# Patient Record
Sex: Female | Born: 1960
Health system: Southern US, Community
[De-identification: ages and names within clinical notes are randomized; demographics above are authoritative.]

## PROBLEM LIST (undated history)

## (undated) DIAGNOSIS — G4730 Sleep apnea, unspecified: Secondary | ICD-10-CM

## (undated) DIAGNOSIS — E785 Hyperlipidemia, unspecified: Secondary | ICD-10-CM

## (undated) DIAGNOSIS — T7840XA Allergy, unspecified, initial encounter: Secondary | ICD-10-CM

## (undated) DIAGNOSIS — N390 Urinary tract infection, site not specified: Secondary | ICD-10-CM

## (undated) DIAGNOSIS — K219 Gastro-esophageal reflux disease without esophagitis: Secondary | ICD-10-CM

## (undated) DIAGNOSIS — J45909 Unspecified asthma, uncomplicated: Secondary | ICD-10-CM

## (undated) DIAGNOSIS — I10 Essential (primary) hypertension: Secondary | ICD-10-CM

## (undated) DIAGNOSIS — N200 Calculus of kidney: Secondary | ICD-10-CM

## (undated) DIAGNOSIS — Z87442 Personal history of urinary calculi: Secondary | ICD-10-CM

## (undated) HISTORY — DX: Essential (primary) hypertension: I10

## (undated) HISTORY — DX: Unspecified asthma, uncomplicated: J45.909

## (undated) HISTORY — DX: Allergy, unspecified, initial encounter: T78.40XA

## (undated) HISTORY — DX: Calculus of kidney: N20.0

## (undated) HISTORY — DX: Gastro-esophageal reflux disease without esophagitis: K21.9

## (undated) HISTORY — DX: Hyperlipidemia, unspecified: E78.5

## (undated) HISTORY — DX: Urinary tract infection, site not specified: N39.0

---

## 2004-07-27 ENCOUNTER — Ambulatory Visit: Payer: Self-pay | Admitting: Unknown Physician Specialty

## 2006-04-12 ENCOUNTER — Ambulatory Visit: Payer: Self-pay | Admitting: Unknown Physician Specialty

## 2007-08-30 ENCOUNTER — Ambulatory Visit: Payer: Self-pay | Admitting: Unknown Physician Specialty

## 2009-01-13 ENCOUNTER — Ambulatory Visit: Payer: Self-pay | Admitting: Unknown Physician Specialty

## 2010-04-21 ENCOUNTER — Ambulatory Visit: Payer: Self-pay | Admitting: Unknown Physician Specialty

## 2011-07-12 ENCOUNTER — Ambulatory Visit: Payer: Self-pay | Admitting: Unknown Physician Specialty

## 2012-10-23 LAB — HM PAP SMEAR: HM Pap smear: NEGATIVE

## 2012-11-19 ENCOUNTER — Ambulatory Visit: Payer: Self-pay | Admitting: Unknown Physician Specialty

## 2013-10-14 ENCOUNTER — Ambulatory Visit: Payer: Self-pay | Admitting: Adult Health

## 2013-10-15 ENCOUNTER — Encounter: Payer: Self-pay | Admitting: Adult Health

## 2013-10-15 ENCOUNTER — Ambulatory Visit (INDEPENDENT_AMBULATORY_CARE_PROVIDER_SITE_OTHER): Payer: BC Managed Care – PPO | Admitting: Adult Health

## 2013-10-15 ENCOUNTER — Encounter (INDEPENDENT_AMBULATORY_CARE_PROVIDER_SITE_OTHER): Payer: Self-pay

## 2013-10-15 VITALS — BP 148/82 | HR 100 | Temp 98.0°F | Resp 14 | Ht 62.0 in | Wt 184.0 lb

## 2013-10-15 DIAGNOSIS — J302 Other seasonal allergic rhinitis: Secondary | ICD-10-CM

## 2013-10-15 DIAGNOSIS — R062 Wheezing: Secondary | ICD-10-CM

## 2013-10-15 DIAGNOSIS — G479 Sleep disorder, unspecified: Secondary | ICD-10-CM

## 2013-10-15 DIAGNOSIS — K219 Gastro-esophageal reflux disease without esophagitis: Secondary | ICD-10-CM

## 2013-10-15 DIAGNOSIS — J309 Allergic rhinitis, unspecified: Secondary | ICD-10-CM

## 2013-10-15 MED ORDER — ALBUTEROL SULFATE HFA 108 (90 BASE) MCG/ACT IN AERS: 2.0000 | INHALATION_SPRAY | Freq: Four times a day (QID) | RESPIRATORY_TRACT | Status: DC | PRN

## 2013-10-15 MED ORDER — ZOLPIDEM TARTRATE 5 MG PO TABS: 5.0000 mg | ORAL_TABLET | Freq: Every evening | ORAL | Status: DC | PRN

## 2013-10-15 NOTE — Progress Notes (Signed)
Subjective:    Patient ID: Ashlee Bass, female    DOB: [birth year], 52 y.o.   MRN: [id number]  HPI  Patient is a very pleasant 52 y/o female who presents to establish care. She was recently followed by Dr. Silver Huguenin. Will request records. Patient reports seasonal allergies. She has used nasal sprays in the past however she does not like these. She is currently taking allergy medication that only provides relief for 4 hours at a time. She has not tried any of the longer acting allergy medications such as Claritin or Allegra. Patient also has been having some problems with wheezing, cough and chest tightness. These symptoms are also associated with her allergies. Patient is not certain if she has asthma; however, she believes she was told that she did have some asthmatic symptoms. At one point she was started on albuterol inhaler as needed. She currently is out of this medication. Patient also has history of GERD. She currently does not take any medications. She has been having symptoms of esophageal burning and her cough is aggravated by the reflux. Patient also reports sleep disturbance for which she takes Ambien 5 mg with good results. Patient does not take this medication daily. She reports taking medication approximately twice a week.   Past Medical History  Diagnosis Date  . Hypertension   . Allergy   . Asthma   . GERD (gastroesophageal reflux disease)   . Urinary tract infection   . Kidney stones   . Hyperlipidemia      History reviewed. No pertinent past surgical history.   Family History  Problem Relation Age of Onset  . Arthritis Mother   . Hyperlipidemia Mother   . Heart disease Mother     Heart Failure  . Hypertension Mother   . Diabetes Mother   . COPD Mother   . Arthritis Father   . Heart disease Father   . Hyperlipidemia Father   . Hypertension Father   . Kidney disease Father     ESRD - dialysis  . Cancer Paternal Grandmother      History   Social  History  . Marital Status: Married    Spouse Name: Richard    Number of Children: 2  . Years of Education: 10   Occupational History  . Arts administrator   Social History Main Topics  . Smoking status: Never Smoker   . Smokeless tobacco: Not on file  . Alcohol Use: No  . Drug Use: No  . Sexual Activity: Not on file   Other Topics Concern  . Not on file   Social History Narrative   Antoria grew up in [place], [place]. She currently lives at home with her husband of 31 years. They have a son and daughter. She recently became a grandmother (granddaughter). 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 She enjoys spending time with her friends. She also enjoys going out to dinner and watching movies.     Review of Systems  Constitutional: Negative.   HENT: Positive for rhinorrhea.   Eyes: Positive for itching.  Respiratory: Negative.   Cardiovascular: Negative.   Gastrointestinal: Negative.   Endocrine: Negative.   Genitourinary: Negative.   Musculoskeletal: Negative.   Skin: Negative.   Neurological: Negative.   Hematological: Negative.   Psychiatric/Behavioral: Negative.        Objective:   Physical Exam  Constitutional: She is oriented to person, place, and time. She appears well-developed and well-nourished.  No distress.  HENT:  Head: Normocephalic and atraumatic.  Pharyngeal erythema.  Eyes: Conjunctivae and EOM are normal. Pupils are equal, round, and reactive to light.  Neck: Normal range of motion. Neck supple. No tracheal deviation present. No thyromegaly present.  Cardiovascular: Normal rate, regular rhythm, normal heart sounds and intact distal pulses.  Exam reveals no gallop and no friction rub.   No murmur heard. Pulmonary/Chest: Effort normal and breath sounds normal. No respiratory distress. She has no wheezes. She has no rales.  Abdominal: Soft. Bowel sounds are normal. She exhibits no distension and no mass. There is no tenderness.  There is no rebound and no guarding.  Musculoskeletal: Normal range of motion. She exhibits no edema and no tenderness.  Lymphadenopathy:    She has no cervical adenopathy.  Neurological: She is alert and oriented to person, place, and time. She has normal reflexes. No cranial nerve deficit. Coordination normal.  Skin: Skin is warm and dry.  Psychiatric: She has a normal mood and affect. Her behavior is normal. Judgment and thought content normal.   BP 148/82  Pulse 100  Temp(Src) 98 F (36.7 C) (Oral)  Resp 14  Ht 5\' 2"  (1.575 m)  Wt 184 lb (83.462 kg)  BMI 33.65 kg/m2  SpO2 94%        Assessment & Plan:

## 2013-10-15 NOTE — Progress Notes (Signed)
Pre visit review using our clinic review tool, if applicable. No additional management support is needed unless otherwise documented below in the visit note. 

## 2013-10-15 NOTE — Patient Instructions (Signed)
  Try Nexium 1 hour before bedtime to see if your symptoms improve.  Also try an over the counter, longer acting allergy medication such as Claritin, Allegra or Zyrtec.  I have given you a prescription for Ambien.  I have prescribed albuterol inhaler - 2 puffs into the lungs as needed every 6 hours for shortness of breath, wheezing or chest tightness.  Thank you for choosing [facility] for your health care needs.

## 2013-10-16 DIAGNOSIS — J302 Other seasonal allergic rhinitis: Secondary | ICD-10-CM | POA: Insufficient documentation

## 2013-10-16 DIAGNOSIS — K219 Gastro-esophageal reflux disease without esophagitis: Secondary | ICD-10-CM | POA: Insufficient documentation

## 2013-10-16 NOTE — Assessment & Plan Note (Signed)
Episodes of wheezing. Currently no symptoms noted. Will provide patient with prescription for albuterol inhaler 2 puffs into the lungs every 6 hours as needed for wheezing, shortness of breath or chest tightness.

## 2013-10-16 NOTE — Assessment & Plan Note (Signed)
Start over-the-counter PPIs such as omeprazole or Nexium.

## 2013-10-16 NOTE — Assessment & Plan Note (Signed)
Patient has been taking 4-hour allergy medication which is quite sedating. Recommended that she try over-the-counter Claritin, Allegra or Zyrtec. She is agreeable.

## 2013-10-16 NOTE — Assessment & Plan Note (Signed)
Continue Ambien 5 mg when necessary.

## 2013-10-22 ENCOUNTER — Telehealth: Payer: Self-pay | Admitting: Adult Health

## 2013-10-22 MED ORDER — PROAIR HFA 108 (90 BASE) MCG/ACT IN AERS: 2.0000 | INHALATION_SPRAY | Freq: Four times a day (QID) | RESPIRATORY_TRACT | Status: DC | PRN

## 2013-10-22 NOTE — Telephone Encounter (Signed)
ProAir has the same medication as the other inhaler just a different manufacturer. Did she ever get the inhaler? We can send in the prescription for ProAir

## 2013-10-22 NOTE — Telephone Encounter (Signed)
Rx sent to pharmacy by escript, pt notified.  

## 2013-10-22 NOTE — Telephone Encounter (Signed)
Pt states pro air will be the alternative for her insurance to cover. She is not feeling better either, wondering if she should just try the new inhaler or come back in.

## 2013-10-22 NOTE — Telephone Encounter (Signed)
Patient left voicemail asking [place] to call in a different inhaler states the one that was previously called in isn't covered under her insurance.

## 2013-10-22 NOTE — Telephone Encounter (Signed)
It was for albuterol?

## 2014-04-09 ENCOUNTER — Encounter: Payer: Self-pay | Admitting: *Deleted

## 2014-05-01 ENCOUNTER — Other Ambulatory Visit: Payer: Self-pay | Admitting: Adult Health

## 2014-05-01 NOTE — Telephone Encounter (Signed)
Ok to fill 

## 2014-05-22 ENCOUNTER — Ambulatory Visit (INDEPENDENT_AMBULATORY_CARE_PROVIDER_SITE_OTHER)
Admission: RE | Admit: 2014-05-22 | Discharge: 2014-05-22 | Disposition: A | Discharge status: Home / Self Care | Payer: BC Managed Care – PPO | Source: Ambulatory Visit | Attending: Adult Health | Admitting: Adult Health

## 2014-05-22 ENCOUNTER — Ambulatory Visit (INDEPENDENT_AMBULATORY_CARE_PROVIDER_SITE_OTHER): Payer: BC Managed Care – PPO | Admitting: Adult Health

## 2014-05-22 ENCOUNTER — Other Ambulatory Visit (HOSPITAL_COMMUNITY)
Admission: RE | Admit: 2014-05-22 | Discharge: 2014-05-22 | Disposition: A | Discharge status: Home / Self Care | Payer: BC Managed Care – PPO | Source: Ambulatory Visit | Attending: Adult Health | Admitting: Adult Health

## 2014-05-22 ENCOUNTER — Encounter: Payer: Self-pay | Admitting: Adult Health

## 2014-05-22 ENCOUNTER — Telehealth: Payer: Self-pay | Admitting: Adult Health

## 2014-05-22 VITALS — BP 150/74 | HR 100 | Temp 98.4°F | Resp 14 | Ht 61.5 in | Wt 183.5 lb

## 2014-05-22 DIAGNOSIS — M25562 Pain in left knee: Secondary | ICD-10-CM

## 2014-05-22 DIAGNOSIS — R1011 Right upper quadrant pain: Secondary | ICD-10-CM

## 2014-05-22 DIAGNOSIS — Z0000 Encounter for general adult medical examination without abnormal findings: Secondary | ICD-10-CM

## 2014-05-22 DIAGNOSIS — Z1211 Encounter for screening for malignant neoplasm of colon: Secondary | ICD-10-CM

## 2014-05-22 DIAGNOSIS — Z1239 Encounter for other screening for malignant neoplasm of breast: Secondary | ICD-10-CM

## 2014-05-22 DIAGNOSIS — M25569 Pain in unspecified knee: Secondary | ICD-10-CM

## 2014-05-22 DIAGNOSIS — Z01419 Encounter for gynecological examination (general) (routine) without abnormal findings: Secondary | ICD-10-CM

## 2014-05-22 DIAGNOSIS — Z1151 Encounter for screening for human papillomavirus (HPV): Secondary | ICD-10-CM | POA: Insufficient documentation

## 2014-05-22 NOTE — Telephone Encounter (Signed)
Xray of the knee was normal. Try to elevate as much as possible. Rest knee as much as possible and apply ice 2-3 times daily for 1 week. Try taking some ibuprofen to help with inflammation. If persistent problems let me know and will refer you to ortho.

## 2014-05-22 NOTE — Progress Notes (Signed)
Pre visit review using our clinic review tool, if applicable. No additional management support is needed unless otherwise documented below in the visit note. 

## 2014-05-22 NOTE — Patient Instructions (Addendum)
  You had your annual physical exam including PAP.  Please schedule a lab appointment. You will need to be fasting.  Please schedule a nurse visit to irrigate both ears.  We will notify you of lab results once available.  See Eber Jonesarolyn prior to leaving the office for scheduling Mammogram, ultrasound.  You will be [facility] by [facility] at [facility] for your consultation for colonoscopy.  [facility] for an [facility] of your left knee

## 2014-05-22 NOTE — Progress Notes (Signed)
Patient ID: Ashlee Bass, female   DOB: October 26, 1960, 53 y.o.   MRN: [id number]   Subjective:    Patient ID: Ashlee Bass, female    DOB: October 26, 1960, 53 y.o.   MRN: [id number]  HPI  Pt is here for her annual physical exam including breast and pap. She is feeling well overall. She reports RUQ discomfort following meals. Occasional nausea as well. Also reports left knee pain. She believes she may have injured it during exercising doing squats. She reports swelling as well.  Needs mammogram and screening colonoscopy. She is not fasting this morning so will return for fasting labs.   Past Medical History  Diagnosis Date  . Hypertension   . Allergy   . Asthma   . GERD (gastroesophageal reflux disease)   . Urinary tract infection   . Kidney stones   . Hyperlipidemia    Surgical Hx: Non contributory  Family History  Problem Relation Age of Onset  . Arthritis Mother   . Hyperlipidemia Mother   . Heart disease Mother     Heart Failure  . Hypertension Mother   . Diabetes Mother   . COPD Mother   . Arthritis Father   . Heart disease Father   . Hyperlipidemia Father   . Hypertension Father   . Kidney disease Father     ESRD - dialysis  . Cancer Paternal Grandmother      History   Social History  . Marital Status: Married    Spouse Name: Richard    Number of Children: 2  . Years of Education: 10   Occupational History  . Arts administratorAssistant Teacher     Playschool   Social History Main Topics  . Smoking status: Never Smoker   . Smokeless tobacco: Not on file  . Alcohol Use: No  . Drug Use: No  . Sexual Activity: Not on file   Other Topics Concern  . Not on file   Social History Narrative   [name] grew up [place], [place]. She currently lives at home with her husband of 31 years. They have a son and daughter. She recently became a grandmother (granddaughter). 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 She enjoys spending time with her friends. She also enjoys going out  to dinner and watching movies.    Current Outpatient Prescriptions on File Prior to Visit  Medication Sig Dispense Refill  . PROAIR HFA 108 (90 BASE) MCG/ACT inhaler Inhale 2 puffs into the lungs every 6 (six) hours as needed for wheezing or shortness of breath.  1 Inhaler  3  . zolpidem (AMBIEN) 5 MG tablet TAKE ONE TABLET BY MOUTH AT BEDTIME AS NEEDED FOR SLEEP  30 tablet  0   No current facility-administered medications on file prior to visit.    Review of Systems  Constitutional: Negative.   HENT: Negative.   Eyes: Negative.   Respiratory: Negative.   Cardiovascular: Negative.   Gastrointestinal: Positive for abdominal pain (RUQ pain following meals).  Endocrine: Negative.   Genitourinary: Negative.   Musculoskeletal: Positive for joint swelling (left knee swelling and pain).  Skin: Negative.   Allergic/Immunologic: Negative.   Neurological: Negative.   Hematological: Negative.   Psychiatric/Behavioral: Negative.        Objective:  There were no vitals taken for this visit.   Physical Exam  Constitutional: She is oriented to person, place, and time. She appears well-developed and well-nourished. No distress.  HENT:  Head: Normocephalic and atraumatic.  Right  Ear: External ear normal.  Left Ear: External ear normal.  Nose: Nose normal.  Mouth/Throat: Oropharynx is clear and moist.  Eyes: Conjunctivae and EOM are normal. Pupils are equal, round, and reactive to light.  Neck: Normal range of motion. Neck supple. No tracheal deviation present. No thyromegaly present.  Cardiovascular: Normal rate, regular rhythm, normal heart sounds and intact distal pulses.  Exam reveals no gallop and no friction rub.   No murmur heard. Pulmonary/Chest: Effort normal and breath sounds normal. No respiratory distress. She has no wheezes. She has no rales.  Abdominal: Soft. Bowel sounds are normal. She exhibits no distension and no mass. There is no tenderness. There is no rebound and no  guarding.  Reports RUQ pain/discomfort mainly following meals. Negative murphy's sign  Musculoskeletal: Normal range of motion. She exhibits edema and tenderness.  Left knee swelling  Lymphadenopathy:    She has no cervical adenopathy.  Neurological: She is alert and oriented to person, place, and time. She has normal reflexes. No cranial nerve deficit. Coordination normal.  Skin: Skin is warm and dry.  Psychiatric: She has a normal mood and affect. Her behavior is normal. Judgment and thought content normal.      Assessment & Plan:   1. Routine general medical examination at a health care facility Normal physical exam. Screenings addressed and listed separately. Labs ordered for screening. - Lipid panel; Future - Basic metabolic panel; Future  2. Encounter for routine gynecological examination Normal exam. External genitalia without lesions, ulcerations, inflammation, warts or discharge. There are no external hemorrhoids. No cystocele, rectocele or prolapsed uterus. Speculum examination normal. Cervix without inflammation, lesions, growth, nodules or discharge noted. There was no bleeding. Vaginal walls also normal - pink and rugose without inflammation, discharge, ulcers or color changes. Bimanual exam also normal.  No tenderness noted with palpation of the uterus. No adnexal masses appreciated during exam.  3. Screening for breast cancer Schedule today at [facility] prior to leaving the office - MM DIGITAL SCREENING BILATERAL; Future  4. Screen for colon cancer Refer to [facility]. Pt would like to stay in [place]. Not had screening colonoscopy. - Ambulatory referral to Gastroenterology  5. RUQ abdominal pain Pain following meals. She reports ongoing discomfort. Mild nausea occasionally. Ultrasound to r/o gallstones. - US Abdomen Limited RUQ; Future  6. Left knee pain Pain follow squats. She thinks she may have hurt during this. Send for xray. Refer as necessary. - DG Knee Complete  4 Views Left; Future

## 2014-05-23 ENCOUNTER — Encounter: Payer: Self-pay | Admitting: *Deleted

## 2014-05-23 LAB — CYTOLOGY - PAP

## 2014-05-23 NOTE — Telephone Encounter (Signed)
Called and advised patient of results,  verbalized understanding. 

## 2014-05-28 ENCOUNTER — Ambulatory Visit: Payer: Self-pay | Admitting: Adult Health

## 2014-05-29 ENCOUNTER — Ambulatory Visit: Payer: BC Managed Care – PPO

## 2014-05-29 ENCOUNTER — Other Ambulatory Visit (INDEPENDENT_AMBULATORY_CARE_PROVIDER_SITE_OTHER): Payer: BC Managed Care – PPO

## 2014-05-29 DIAGNOSIS — H6123 Impacted cerumen, bilateral: Secondary | ICD-10-CM

## 2014-05-29 DIAGNOSIS — Z0000 Encounter for general adult medical examination without abnormal findings: Secondary | ICD-10-CM

## 2014-05-29 LAB — TSH: TSH: 3.27 u[IU]/mL (ref 0.35–4.50)

## 2014-05-29 LAB — CBC WITH DIFFERENTIAL/PLATELET
Basophils Absolute: 0 10*3/uL (ref 0.0–0.1)
Basophils Relative: 0.6 % (ref 0.0–3.0)
EOS PCT: 4.1 % (ref 0.0–5.0)
Eosinophils Absolute: 0.2 10*3/uL (ref 0.0–0.7)
HEMATOCRIT: 41.4 % (ref 36.0–46.0)
HEMOGLOBIN: 13.6 g/dL (ref 12.0–15.0)
LYMPHS ABS: 1.8 10*3/uL (ref 0.7–4.0)
Lymphocytes Relative: 29.9 % (ref 12.0–46.0)
MCHC: 33 g/dL (ref 30.0–36.0)
MCV: 85.8 fl (ref 78.0–100.0)
MONO ABS: 0.4 10*3/uL (ref 0.1–1.0)
MONOS PCT: 6.6 % (ref 3.0–12.0)
NEUTROS ABS: 3.5 10*3/uL (ref 1.4–7.7)
Neutrophils Relative %: 58.8 % (ref 43.0–77.0)
PLATELETS: 280 10*3/uL (ref 150.0–400.0)
RBC: 4.82 Mil/uL (ref 3.87–5.11)
RDW: 15 % (ref 11.5–15.5)
WBC: 6 10*3/uL (ref 4.0–10.5)

## 2014-05-29 LAB — BASIC METABOLIC PANEL
BUN: 13 mg/dL (ref 6–23)
CHLORIDE: 108 meq/L (ref 96–112)
CO2: 28 meq/L (ref 19–32)
CREATININE: 0.7 mg/dL (ref 0.4–1.2)
Calcium: 9.9 mg/dL (ref 8.4–10.5)
GFR: 91.92 mL/min (ref >60.00)
Glucose, Bld: 113 mg/dL — ABNORMAL HIGH (ref 70–99)
POTASSIUM: 4.6 meq/L (ref 3.5–5.1)
Sodium: 143 mEq/L (ref 135–145)

## 2014-05-29 LAB — LIPID PANEL
CHOL/HDL RATIO: 5
Cholesterol: 239 mg/dL — ABNORMAL HIGH (ref 0–200)
HDL: 45.6 mg/dL (ref >39.00)
LDL Cholesterol: 176 mg/dL — ABNORMAL HIGH (ref 0–99)
NonHDL: 193.4
Triglycerides: 89 mg/dL (ref 0.0–149.0)
VLDL: 17.8 mg/dL (ref 0.0–40.0)

## 2014-05-29 LAB — VITAMIN B12: Vitamin B-12: 203 pg/mL — ABNORMAL LOW (ref 211–911)

## 2014-05-29 LAB — VITAMIN D 25 HYDROXY (VIT D DEFICIENCY, FRACTURES): VITD: 33.69 ng/mL (ref 30.00–100.00)

## 2014-05-29 NOTE — Progress Notes (Signed)
Pre visit review using our clinic review tool, if applicable. No additional management support is needed unless otherwise documented below in the visit note. 

## 2014-06-04 ENCOUNTER — Ambulatory Visit (INDEPENDENT_AMBULATORY_CARE_PROVIDER_SITE_OTHER): Payer: BC Managed Care – PPO | Admitting: *Deleted

## 2014-06-04 ENCOUNTER — Telehealth: Payer: Self-pay | Admitting: *Deleted

## 2014-06-04 DIAGNOSIS — E538 Deficiency of other specified B group vitamins: Secondary | ICD-10-CM

## 2014-06-04 MED ORDER — CYANOCOBALAMIN 1000 MCG/ML IJ SOLN
1000.0000 ug | Freq: Once | INTRAMUSCULAR | Status: AC
  Administered 2014-06-04: 1000 ug via INTRAMUSCULAR

## 2014-06-04 NOTE — Telephone Encounter (Signed)
Pt in office for B12, asked about ultrasound results from last week. Advised her abdominal ultrasound was normal per [name].

## 2014-06-11 ENCOUNTER — Ambulatory Visit (INDEPENDENT_AMBULATORY_CARE_PROVIDER_SITE_OTHER): Payer: BC Managed Care – PPO | Admitting: *Deleted

## 2014-06-11 ENCOUNTER — Other Ambulatory Visit: Payer: Self-pay

## 2014-06-11 ENCOUNTER — Telehealth: Payer: Self-pay | Admitting: Adult Health

## 2014-06-11 DIAGNOSIS — E538 Deficiency of other specified B group vitamins: Secondary | ICD-10-CM

## 2014-06-11 MED ORDER — ZOLPIDEM TARTRATE 5 MG PO TABS: 5.0000 mg | ORAL_TABLET | Freq: Every evening | ORAL | Status: DC | PRN

## 2014-06-11 MED ORDER — METOPROLOL SUCCINATE ER 200 MG PO TB24: 200.0000 mg | ORAL_TABLET | Freq: Every day | ORAL | Status: DC

## 2014-06-11 MED ORDER — CYANOCOBALAMIN 1000 MCG/ML IJ SOLN
1000.0000 ug | Freq: Once | INTRAMUSCULAR | Status: AC
  Administered 2014-06-11: 1000 ug via INTRAMUSCULAR

## 2014-06-11 NOTE — Telephone Encounter (Signed)
Pt came in and stated was needing a refill on her metoprolol 100mg  and ambien 5 mg.

## 2014-06-11 NOTE — Telephone Encounter (Signed)
Metoprolol refilled message sent to [name] requesting approval to print [place] script.

## 2014-06-11 NOTE — Telephone Encounter (Signed)
Patient called requesting a refill on ambien. Is it ok to refill?

## 2014-06-17 ENCOUNTER — Ambulatory Visit: Payer: Self-pay | Admitting: Adult Health

## 2014-06-17 ENCOUNTER — Ambulatory Visit (INDEPENDENT_AMBULATORY_CARE_PROVIDER_SITE_OTHER): Payer: BC Managed Care – PPO

## 2014-06-17 DIAGNOSIS — E538 Deficiency of other specified B group vitamins: Secondary | ICD-10-CM

## 2014-06-17 LAB — HM MAMMOGRAPHY: HM Mammogram: NEGATIVE

## 2014-06-17 MED ORDER — METOPROLOL SUCCINATE ER 100 MG PO TB24: 100.0000 mg | ORAL_TABLET | Freq: Every day | ORAL | Status: DC

## 2014-06-17 MED ORDER — ZOLPIDEM TARTRATE 5 MG PO TABS: 5.0000 mg | ORAL_TABLET | Freq: Every evening | ORAL | Status: DC | PRN

## 2014-06-17 MED ORDER — CYANOCOBALAMIN 1000 MCG/ML IJ SOLN
1000.0000 ug | Freq: Once | INTRAMUSCULAR | Status: AC
  Administered 2014-06-17: 1000 ug via INTRAMUSCULAR

## 2014-06-18 ENCOUNTER — Other Ambulatory Visit: Payer: Self-pay

## 2014-06-18 ENCOUNTER — Telehealth: Payer: Self-pay

## 2014-06-18 MED ORDER — METOPROLOL TARTRATE 100 MG PO TABS: 100.0000 mg | ORAL_TABLET | Freq: Every day | ORAL | Status: DC

## 2014-06-18 NOTE — Telephone Encounter (Signed)
Opened in error

## 2014-06-18 NOTE — Telephone Encounter (Signed)
Received a request fax from [facility] stating patient is on metoprolol tartate . Not metoprolol succinate. Updated medication list and sent new Rx refill to [facility]

## 2014-06-19 ENCOUNTER — Encounter: Payer: Self-pay | Admitting: Adult Health

## 2014-06-25 DIAGNOSIS — R1011 Right upper quadrant pain: Secondary | ICD-10-CM | POA: Insufficient documentation

## 2014-06-26 ENCOUNTER — Ambulatory Visit: Payer: BC Managed Care – PPO

## 2014-06-26 ENCOUNTER — Ambulatory Visit (INDEPENDENT_AMBULATORY_CARE_PROVIDER_SITE_OTHER): Payer: BC Managed Care – PPO | Admitting: *Deleted

## 2014-06-26 DIAGNOSIS — E538 Deficiency of other specified B group vitamins: Secondary | ICD-10-CM

## 2014-06-26 MED ORDER — CYANOCOBALAMIN 1000 MCG/ML IJ SOLN
1000.0000 ug | Freq: Once | INTRAMUSCULAR | Status: AC
  Administered 2014-06-26: 1000 ug via INTRAMUSCULAR

## 2014-08-08 ENCOUNTER — Telehealth: Payer: Self-pay | Admitting: *Deleted

## 2014-08-08 NOTE — Telephone Encounter (Signed)
Pt called states she was given the name of something she could buy over the counter to clean her ears, however she forgot the name.  I spoke with the patient and advised her of Debrox.

## 2014-08-12 ENCOUNTER — Ambulatory Visit (INDEPENDENT_AMBULATORY_CARE_PROVIDER_SITE_OTHER): Payer: BC Managed Care – PPO | Admitting: *Deleted

## 2014-08-12 DIAGNOSIS — E538 Deficiency of other specified B group vitamins: Secondary | ICD-10-CM

## 2014-08-12 MED ORDER — CYANOCOBALAMIN 1000 MCG/ML IJ SOLN
1000.0000 ug | Freq: Once | INTRAMUSCULAR | Status: AC
  Administered 2014-08-12: 1000 ug via INTRAMUSCULAR

## 2014-08-13 ENCOUNTER — Encounter: Payer: Self-pay | Admitting: Family Medicine

## 2014-08-13 ENCOUNTER — Ambulatory Visit (INDEPENDENT_AMBULATORY_CARE_PROVIDER_SITE_OTHER): Payer: BC Managed Care – PPO | Admitting: Family Medicine

## 2014-08-13 VITALS — BP 150/86 | HR 73 | Temp 98.4°F | Ht 61.5 in | Wt 187.5 lb

## 2014-08-13 DIAGNOSIS — H6981 Other specified disorders of Eustachian tube, right ear: Secondary | ICD-10-CM

## 2014-08-13 DIAGNOSIS — H6990 Unspecified Eustachian tube disorder, unspecified ear: Secondary | ICD-10-CM | POA: Insufficient documentation

## 2014-08-13 DIAGNOSIS — J302 Other seasonal allergic rhinitis: Secondary | ICD-10-CM

## 2014-08-13 MED ORDER — FLUTICASONE PROPIONATE 50 MCG/ACT NA SUSP: 2.0000 | Freq: Every day | NASAL | Status: DC

## 2014-08-13 NOTE — Assessment & Plan Note (Signed)
R side  With fullness and dec in hearing  No cerumen impaction  Trial of flonase-demonstrated technique Antihistamine prn Update if no imp in several weeks Would not recommend decongestant with present bp   (needs to disc with PCP)

## 2014-08-13 NOTE — Progress Notes (Signed)
Pre visit review using our clinic review tool, if applicable. No additional management support is needed unless otherwise documented below in the visit note. 

## 2014-08-13 NOTE — Assessment & Plan Note (Signed)
I suspect causing her ETD  Will try otc non sed antihistamine and steroid nasal spray Update if not imp

## 2014-08-13 NOTE — Patient Instructions (Signed)
I think you have eustacian tube dysfunction - ( clogged ear tube from allergies)  You can take antihistamines (claritin/ zyrtec)  Also try flonase nasal spray -- tilt head down and use a small sniff when you try it  Let me know if no improvement in the next several weeks

## 2014-08-13 NOTE — Progress Notes (Signed)
Subjective:    Patient ID: Ashlee Bass, female    DOB: 1961-06-21, 53 y.o.   MRN: [id number]  HPI Here with R ear fullness- and trouble hearing   She had a bit of throbbing in it over the weekend   Used otc ear cleaning solution -did not help  Last irrigation in office was perhaps in the summer   She does have allergies and sinus congestion with weather change  She takes chol trimeton lately   Patient Active Problem List   Diagnosis Date Noted  . Routine general medical examination at a health care facility 05/22/2014  . Encounter for routine gynecological examination 05/22/2014  . GERD (gastroesophageal reflux disease) 10/16/2013  . Wheezing 10/16/2013  . Seasonal allergies 10/16/2013  . Sleep disturbance 10/15/2013   Past Medical History  Diagnosis Date  . Hypertension   . Allergy   . Asthma   . GERD (gastroesophageal reflux disease)   . Urinary tract infection   . Kidney stones   . Hyperlipidemia    No past surgical history on file. History  Substance Use Topics  . Smoking status: Never Smoker   . Smokeless tobacco: Never Used  . Alcohol Use: No   Family History  Problem Relation Age of Onset  . Arthritis Mother   . Hyperlipidemia Mother   . Heart disease Mother     Heart Failure  . Hypertension Mother   . Diabetes Mother   . COPD Mother   . Arthritis Father   . Heart disease Father   . Hyperlipidemia Father   . Hypertension Father   . Kidney disease Father     ESRD - dialysis  . Cancer Paternal Grandmother    Allergies  Allergen Reactions  . Codeine Nausea And Vomiting  . Penicillins Hives  . Azithromycin Itching and Rash   Current Outpatient Prescriptions on File Prior to Visit  Medication Sig Dispense Refill  . metoprolol (LOPRESSOR) 100 MG tablet Take 1 tablet (100 mg total) by mouth daily.  90 tablet  3  . PROAIR HFA 108 (90 BASE) MCG/ACT inhaler Inhale 2 puffs into the lungs every 6 (six) hours as needed for wheezing or shortness  of breath.  1 Inhaler  3  . zolpidem (AMBIEN) 5 MG tablet Take 1 tablet (5 mg total) by mouth at bedtime as needed for sleep.  30 tablet  3   No current facility-administered medications on file prior to visit.      Patient Active Problem List   Diagnosis Date Noted  . Routine general medical examination at a health care facility 05/22/2014  . Encounter for routine gynecological examination 05/22/2014  . GERD (gastroesophageal reflux disease) 10/16/2013  . Wheezing 10/16/2013  . Seasonal allergies 10/16/2013  . Sleep disturbance 10/15/2013   Past Medical History  Diagnosis Date  . Hypertension   . Allergy   . Asthma   . GERD (gastroesophageal reflux disease)   . Urinary tract infection   . Kidney stones   . Hyperlipidemia    No past surgical history on file. History  Substance Use Topics  . Smoking status: Never Smoker   . Smokeless tobacco: Never Used  . Alcohol Use: No   Family History  Problem Relation Age of Onset  . Arthritis Mother   . Hyperlipidemia Mother   . Heart disease Mother     Heart Failure  . Hypertension Mother   . Diabetes Mother   . COPD Mother   . Arthritis Father   .  Heart disease Father   . Hyperlipidemia Father   . Hypertension Father   . Kidney disease Father     ESRD - dialysis  . Cancer Paternal Grandmother    Allergies  Allergen Reactions  . Codeine Nausea And Vomiting  . Penicillins Hives  . Azithromycin Itching and Rash   Current Outpatient Prescriptions on File Prior to Visit  Medication Sig Dispense Refill  . metoprolol (LOPRESSOR) 100 MG tablet Take 1 tablet (100 mg total) by mouth daily.  90 tablet  3  . PROAIR HFA 108 (90 BASE) MCG/ACT inhaler Inhale 2 puffs into the lungs every 6 (six) hours as needed for wheezing or shortness of breath.  1 Inhaler  3  . zolpidem (AMBIEN) 5 MG tablet Take 1 tablet (5 mg total) by mouth at bedtime as needed for sleep.  30 tablet  3   No current facility-administered medications on file  prior to visit.    Review of Systems Review of Systems  Constitutional: Negative for fever, appetite change, fatigue and unexpected weight change.  ENT pos for cong and rhinorrhea and sneezing , neg for ear drainage  Eyes: Negative for pain and visual disturbance.  Respiratory: Negative for cough and shortness of breath.   Cardiovascular: Negative for cp or palpitations    Gastrointestinal: Negative for nausea, diarrhea and constipation.  Genitourinary: Negative for urgency and frequency.  Skin: Negative for pallor or rash   Neurological: Negative for weakness, light-headedness, numbness and headaches.  Hematological: Negative for adenopathy. Does not bruise/bleed easily.  Psychiatric/Behavioral: Negative for dysphoric mood. The patient is not nervous/anxious.         Objective:   Physical Exam  Constitutional: She appears well-developed and well-nourished. No distress.  HENT:  Head: Normocephalic and atraumatic.  Mouth/Throat: Oropharynx is clear and moist.  Nares are injected and congested  Clear rhinorrhea No sinus tenderness   TMs are dull bilaterally   Eyes: Conjunctivae and EOM are normal. Pupils are equal, round, and reactive to light. Right eye exhibits no discharge. Left eye exhibits no discharge.  Neck: Normal range of motion. Neck supple.  Cardiovascular: Normal rate and regular rhythm.   Pulmonary/Chest: Effort normal and breath sounds normal. No respiratory distress. She has no wheezes. She exhibits no tenderness.  Lymphadenopathy:    She has no cervical adenopathy.  Neurological: She is alert. She has normal reflexes. No cranial nerve deficit. She exhibits normal muscle tone. Coordination normal.  Skin: Skin is warm and dry. No pallor.  Psychiatric: She has a normal mood and affect.          Assessment & Plan:   Problem List Items Addressed This Visit     Respiratory   Seasonal allergies - Primary     I suspect causing her ETD  Will try otc non sed  antihistamine and steroid nasal spray Update if not imp       Nervous and Auditory   ETD (eustachian tube dysfunction)     R side  With fullness and dec in hearing  No cerumen impaction  Trial of flonase-demonstrated technique Antihistamine prn Update if no imp in several weeks Would not recommend decongestant with present bp   (needs to disc with PCP)

## 2014-08-19 DIAGNOSIS — Z8371 Family history of colonic polyps: Secondary | ICD-10-CM | POA: Insufficient documentation

## 2014-09-30 LAB — HM COLONOSCOPY

## 2014-12-18 ENCOUNTER — Ambulatory Visit: Payer: Self-pay | Admitting: Gastroenterology

## 2014-12-24 ENCOUNTER — Other Ambulatory Visit: Payer: Self-pay | Admitting: *Deleted

## 2014-12-31 ENCOUNTER — Other Ambulatory Visit: Payer: Self-pay | Admitting: *Deleted

## 2014-12-31 ENCOUNTER — Encounter: Payer: Self-pay | Admitting: Nurse Practitioner

## 2014-12-31 ENCOUNTER — Ambulatory Visit (INDEPENDENT_AMBULATORY_CARE_PROVIDER_SITE_OTHER): Payer: BLUE CROSS/BLUE SHIELD | Admitting: Nurse Practitioner

## 2014-12-31 VITALS — BP 128/72 | HR 76 | Temp 97.6°F | Resp 14 | Ht 61.5 in | Wt 190.0 lb

## 2014-12-31 DIAGNOSIS — G479 Sleep disorder, unspecified: Secondary | ICD-10-CM

## 2014-12-31 DIAGNOSIS — E669 Obesity, unspecified: Secondary | ICD-10-CM | POA: Insufficient documentation

## 2014-12-31 MED ORDER — PHENTERMINE HCL 37.5 MG PO CAPS: 37.5000 mg | ORAL_CAPSULE | ORAL | Status: DC

## 2014-12-31 MED ORDER — ZOLPIDEM TARTRATE 5 MG PO TABS: 5.0000 mg | ORAL_TABLET | Freq: Every evening | ORAL | Status: DC | PRN

## 2014-12-31 NOTE — Progress Notes (Signed)
Pre visit review using our clinic review tool, if applicable. No additional management support is needed unless otherwise documented below in the visit note. 

## 2014-12-31 NOTE — Assessment & Plan Note (Signed)
Continue Ambien 5 mg every other night to help with sleep.

## 2014-12-31 NOTE — Assessment & Plan Note (Signed)
Pt obese and interested in losing weight. She is motivated and would like some help. Gave her the handout with low glycemic index information and exercise info. Phentermine 37.5 capsules daily. Asked her to let me know her BP in 1 week and to stop for a day or two if BP or Pulse rises above given guidelines. If BP and pulse are okay will give 2 months more of medication. 9.5 lbs in 3 months goal.

## 2014-12-31 NOTE — Patient Instructions (Signed)
Please have your vital signs checked a week after starting and let me know the results.  I will need those measurements, or you can return for an RN visit, before I can refill the phentermine for additional months.  If pulse is > 100 or BP is > 140/80 we should repeat your evaluation after being off of phentermine for a few days.  If both are below those parameters,  You can continue the medication and return to see me in 3 months.  Most people take 1/2 tablet in the morning,  The second half by 2 PM to avoid insomnia.   If you have not lost 9.5 lbs (which is 5% of your starting weight)  by the end of the 3 months, the risks of continuing the medication outweigh the benefits and we will have to discontinue it and find a Plan B .

## 2014-12-31 NOTE — Progress Notes (Signed)
   Subjective:    Patient ID: Ashlee Bass, female    DOB: 1961/04/23, 54 y.o.   MRN: [id number]  HPI  Ashlee Bass is a 54 yo female with a CC needing refill of ambien.   1) Staying asleep is hard, been on Ambien 5 mg a year, takes every other day. Still finds it very helpful   2) Daughter getting married in 1 year and is interested in losing weight. A friend is trying phentermine with good luck and she is asking about this option.   Review of Systems  Constitutional: Negative for fever, chills, diaphoresis and fatigue.  Respiratory: Negative for chest tightness, shortness of breath and wheezing.   Cardiovascular: Negative for chest pain, palpitations and leg swelling.  Gastrointestinal: Negative for nausea, vomiting, abdominal pain, diarrhea, blood in stool and abdominal distention.  Skin: Negative for rash.  Neurological: Negative for dizziness, weakness, numbness and headaches.  Psychiatric/Behavioral: Positive for sleep disturbance. The patient is not nervous/anxious.        Controlled on Ambien       Objective:   Physical Exam  Constitutional: She is oriented to person, place, and time. She appears well-developed and well-nourished. No distress.  BP 128/72 mmHg  Pulse 76  Temp(Src) 97.6 F (36.4 C) (Oral)  Resp 14  Ht 5' 1.5" (1.562 m)  Wt 190 lb (86.183 kg)  BMI 35.32 kg/m2  SpO2 98%   HENT:  Head: Normocephalic and atraumatic.  Right Ear: External ear normal.  Left Ear: External ear normal.  Cardiovascular: Normal rate, regular rhythm, normal heart sounds and intact distal pulses.  Exam reveals no gallop and no friction rub.   No murmur heard. Pulmonary/Chest: Effort normal and breath sounds normal. No respiratory distress. She has no wheezes. She has no rales. She exhibits no tenderness.  Abdominal:  Obese  Neurological: She is alert and oriented to person, place, and time. No cranial nerve deficit. She exhibits normal muscle tone. Coordination normal.    Skin: Skin is warm and dry. No rash noted. She is not diaphoretic.  Psychiatric: She has a normal mood and affect. Her behavior is normal. Judgment and thought content normal.      Assessment & Plan:

## 2014-12-31 NOTE — Telephone Encounter (Signed)
error 

## 2015-01-30 ENCOUNTER — Encounter: Payer: Self-pay | Admitting: Nurse Practitioner

## 2015-02-16 LAB — SURGICAL PATHOLOGY

## 2015-04-20 ENCOUNTER — Telehealth: Payer: Self-pay

## 2015-04-20 NOTE — Telephone Encounter (Signed)
Oncology Nurse Navigator Documentation  Oncology Nurse Navigator Flowsheets 04/20/2015  Navigator Encounter Type Telephone  Coordination of Care EUS  Time Spent with Patient 15   EUS arranged for 06/18/15. Pt will get H&P from her medical MD at [facility] and fax copy before procedure.Denies any anticoagulants. Instuctions also mailed to home address.

## 2015-05-28 ENCOUNTER — Telehealth: Payer: Self-pay

## 2015-05-28 NOTE — Telephone Encounter (Signed)
  Oncology Nurse Navigator Documentation    Navigator Encounter Type: Telephone (05/28/15 1100)               Called to see if Ms Devora was able to get appt with primary care physician to get updated H&P for 8/25 EUS. 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 Dr Shelah Lewandowsky office notified.

## 2015-07-27 ENCOUNTER — Other Ambulatory Visit: Payer: Self-pay | Admitting: Nurse Practitioner

## 2015-07-27 NOTE — Telephone Encounter (Signed)
Received a refill request for Zolpidem. Last refilled 12/31/14 for #30 with 3 refills. Last office visit was 12/31/14. Okay to refill?

## 2015-07-29 ENCOUNTER — Other Ambulatory Visit: Payer: Self-pay | Admitting: Nurse Practitioner

## 2015-07-29 NOTE — Telephone Encounter (Signed)
Okay to refill? Last seen in March 2016

## 2015-07-30 ENCOUNTER — Telehealth: Payer: Self-pay | Admitting: *Deleted

## 2015-07-30 ENCOUNTER — Other Ambulatory Visit: Payer: Self-pay

## 2015-07-30 MED ORDER — METOPROLOL TARTRATE 100 MG PO TABS: 100.0000 mg | ORAL_TABLET | Freq: Every day | ORAL | Status: DC

## 2015-07-30 NOTE — Telephone Encounter (Signed)
Rx faxed by Shawna Orleans

## 2015-07-30 NOTE — Telephone Encounter (Signed)
Patient has requested a follow up on her Rx Reill on Ambien and metoprolol( 90 day supply) -Thanks

## 2015-07-30 NOTE — Telephone Encounter (Signed)
Called patient, refills completed.

## 2015-09-07 ENCOUNTER — Telehealth: Payer: Self-pay | Admitting: Nurse Practitioner

## 2015-09-07 NOTE — Telephone Encounter (Signed)
Called to ask if she wanted to make an appointment to be seen in the office.

## 2015-09-07 NOTE — Telephone Encounter (Signed)
Patient called stating she has a cold and was wondering if medication could be sent in for her?

## 2015-09-08 ENCOUNTER — Ambulatory Visit (INDEPENDENT_AMBULATORY_CARE_PROVIDER_SITE_OTHER): Payer: BLUE CROSS/BLUE SHIELD | Admitting: Nurse Practitioner

## 2015-09-08 VITALS — BP 169/86 | HR 82 | Temp 98.7°F | Resp 16 | Ht 61.5 in | Wt 196.4 lb

## 2015-09-08 DIAGNOSIS — J069 Acute upper respiratory infection, unspecified: Secondary | ICD-10-CM

## 2015-09-08 DIAGNOSIS — I10 Essential (primary) hypertension: Secondary | ICD-10-CM | POA: Diagnosis not present

## 2015-09-08 MED ORDER — ZOLPIDEM TARTRATE 5 MG PO TABS: 5.0000 mg | ORAL_TABLET | Freq: Every evening | ORAL | Status: DC | PRN

## 2015-09-08 MED ORDER — PROAIR HFA 108 (90 BASE) MCG/ACT IN AERS: 2.0000 | INHALATION_SPRAY | Freq: Four times a day (QID) | RESPIRATORY_TRACT | Status: DC | PRN

## 2015-09-08 MED ORDER — DOXYCYCLINE HYCLATE 100 MG PO TABS: 100.0000 mg | ORAL_TABLET | Freq: Two times a day (BID) | ORAL | Status: DC

## 2015-09-08 NOTE — Progress Notes (Signed)
Pre visit review using our clinic review tool, if applicable. No additional management support is needed unless otherwise documented below in the visit note. 

## 2015-09-08 NOTE — Patient Instructions (Signed)
Lopressor has refills and you just need to contact your pharmacy.   Doxycyline 100 mg twice daily x 7 days Please take a probiotic ( Align, Floraque or Culturelle) while you are on the antibiotic to prevent a serious antibiotic associated diarrhea  Called clostirudium dificile colitis and a vaginal yeast infection  Inhaler for wheezing  Follow up for weight loss concerns in 2-4 weeks.

## 2015-09-08 NOTE — Progress Notes (Signed)
Patient ID: Ashlee Bass, female    DOB: 18-Nov-1960  Age: 54 y.o. MRN: [id number]  CC: URI   HPI Ashlee Bass presents for URI symptoms x 6 days.  1) Pt reports 6 days of nasal and chest Congestion, wheezing, coughing non-productive, and headache.  denies fever, chills, rashes, N/V/D Sick contacts with pre-schoolers   Patient also wants to discuss weight concerns today (deferred to another day)   History Ashlee Bass has a past medical history of Hypertension; Allergy; Asthma; GERD (gastroesophageal reflux disease); Urinary tract infection; Kidney stones; and Hyperlipidemia.   She has no past surgical history on file.   Her family history includes Arthritis in her father and mother; COPD in her mother; Cancer in her paternal grandmother; Diabetes in her mother; Heart disease in her father and mother; Hyperlipidemia in her father and mother; Hypertension in her father and mother; Kidney disease in her father.She reports that she has never smoked. She has never used smokeless tobacco. She reports that she does not drink alcohol or use illicit drugs.  Outpatient Prescriptions Prior to Visit  Medication Sig Dispense Refill  . fluticasone (FLONASE) 50 MCG/ACT nasal spray Place 2 sprays into both nostrils daily. 16 g 6  . metoprolol (LOPRESSOR) 100 MG tablet Take 1 tablet (100 mg total) by mouth daily. 90 tablet 3  . omeprazole (PRILOSEC) 20 MG capsule Take by mouth.    . phentermine 37.5 MG capsule Take 1 capsule (37.5 mg total) by mouth every morning. 30 capsule 0  . PROAIR HFA 108 (90 BASE) MCG/ACT inhaler Inhale 2 puffs into the lungs every 6 (six) hours as needed for wheezing or shortness of breath. 1 Inhaler 3  . zolpidem (AMBIEN) 5 MG tablet TAKE ONE TABLET BY MOUTH AT BEDTIME AS NEEDED FOR SLEEP 30 tablet 0   No facility-administered medications prior to visit.    ROS Review of Systems  HENT: Positive for congestion, ear pain, postnasal drip, rhinorrhea, sinus pressure,  sneezing and sore throat. Negative for ear discharge, trouble swallowing and voice change.   Eyes: Negative for visual disturbance.  Respiratory: Positive for cough, chest tightness and wheezing. Negative for shortness of breath.   Cardiovascular: Negative for chest pain, palpitations and leg swelling.  Gastrointestinal: Negative for nausea, vomiting, diarrhea and constipation.  Neurological: Positive for headaches. Negative for dizziness.    Objective:  BP 169/86 mmHg  Pulse 82  Temp(Src) 98.7 F (37.1 C)  Resp 16  Ht 5' 1.5" (1.562 m)  Wt 196 lb 6.4 oz (89.086 kg)  BMI 36.51 kg/m2  SpO2 96%  Physical Exam  Constitutional: She is oriented to person, place, and time. She appears well-developed and well-nourished. No distress.  HENT:  Head: Normocephalic and atraumatic.  Right Ear: External ear normal.  Left Ear: External ear normal.  Mouth/Throat: No oropharyngeal exudate.  TM's clear bilaterally  Eyes: EOM are normal. Pupils are equal, round, and reactive to light. Right eye exhibits no discharge. Left eye exhibits no discharge. No scleral icterus.  Neck: Normal range of motion. Neck supple. No thyromegaly present.  Cardiovascular: Normal rate, regular rhythm and normal heart sounds.  Exam reveals no gallop and no friction rub.   No murmur heard. Pulmonary/Chest: Effort normal. No respiratory distress. She has wheezes. She has no rales. She exhibits no tenderness.  Lymphadenopathy:    She has no cervical adenopathy.  Neurological: She is alert and oriented to person, place, and time. No cranial nerve deficit. She exhibits normal muscle tone. Coordination normal.  Skin: Skin is warm and dry. No rash noted. She is not diaphoretic.  Psychiatric: She has a normal mood and affect. Her behavior is normal. Judgment and thought content normal.   Assessment & Plan:   Ashlee Bass was seen today for uri.  Diagnoses and all orders for this visit:  Benign essential HTN -     Comp Met  (CMET) -     CBC with Differential/Platelet  Acute URI  Other orders -     PROAIR HFA 108 (90 BASE) MCG/ACT inhaler; Inhale 2 puffs into the lungs every 6 (six) hours as needed for wheezing or shortness of breath. -     zolpidem (AMBIEN) 5 MG tablet; Take 1 tablet (5 mg total) by mouth at bedtime as needed. for sleep -     doxycycline (VIBRA-TABS) 100 MG tablet; Take 1 tablet (100 mg total) by mouth 2 (two) times daily.   I have discontinued Ashlee Bass phentermine. I have also changed her zolpidem. Additionally, I am having her start on doxycycline. Lastly, I am having her maintain her omeprazole, fluticasone, metoprolol, and PROAIR HFA.  Meds ordered this encounter  Medications  . PROAIR HFA 108 (90 BASE) MCG/ACT inhaler    Sig: Inhale 2 puffs into the lungs every 6 (six) hours as needed for wheezing or shortness of breath.    Dispense:  1 Inhaler    Refill:  3    Pt's insurance will cover Proair    Order Specific Question:  Supervising Provider    Answer:  Deborra Medina L [[id number]]  . zolpidem (AMBIEN) 5 MG tablet    Sig: Take 1 tablet (5 mg total) by mouth at bedtime as needed. for sleep    Dispense:  30 tablet    Refill:  1    Order Specific Question:  Supervising Provider    Answer:  Deborra Medina L [[id number]]  . doxycycline (VIBRA-TABS) 100 MG tablet    Sig: Take 1 tablet (100 mg total) by mouth 2 (two) times daily.    Dispense:  14 tablet    Refill:  0    Order Specific Question:  Supervising Provider    Answer:  Crecencio Mc [[id number]]     Follow-up: Return in about 4 weeks (around 10/06/2015) for Weight loss follow up .

## 2015-09-09 ENCOUNTER — Other Ambulatory Visit: Payer: BLUE CROSS/BLUE SHIELD

## 2015-09-09 LAB — CBC WITH DIFFERENTIAL/PLATELET
BASOS PCT: 0.8 % (ref 0.0–3.0)
Basophils Absolute: 0.1 10*3/uL (ref 0.0–0.1)
EOS ABS: 0.7 10*3/uL (ref 0.0–0.7)
Eosinophils Relative: 7.5 % — ABNORMAL HIGH (ref 0.0–5.0)
HEMATOCRIT: 42.5 % (ref 36.0–46.0)
Hemoglobin: 13.6 g/dL (ref 12.0–15.0)
LYMPHS PCT: 25.9 % (ref 12.0–46.0)
Lymphs Abs: 2.5 10*3/uL (ref 0.7–4.0)
MCHC: 32.2 g/dL (ref 30.0–36.0)
MCV: 86.1 fl (ref 78.0–100.0)
MONO ABS: 0.7 10*3/uL (ref 0.1–1.0)
Monocytes Relative: 7.6 % (ref 3.0–12.0)
NEUTROS ABS: 5.5 10*3/uL (ref 1.4–7.7)
Neutrophils Relative %: 58.2 % (ref 43.0–77.0)
PLATELETS: 307 10*3/uL (ref 150.0–400.0)
RBC: 4.93 Mil/uL (ref 3.87–5.11)
RDW: 14.5 % (ref 11.5–15.5)
WBC: 9.5 10*3/uL (ref 4.0–10.5)

## 2015-09-09 LAB — COMPREHENSIVE METABOLIC PANEL
ALT: 24 U/L (ref 0–35)
AST: 26 U/L (ref 0–37)
Albumin: 4.1 g/dL (ref 3.5–5.2)
Alkaline Phosphatase: 97 U/L (ref 39–117)
BUN: 9 mg/dL (ref 6–23)
CO2: 30 mEq/L (ref 19–32)
Calcium: 9.9 mg/dL (ref 8.4–10.5)
Chloride: 103 mEq/L (ref 96–112)
Creatinine, Ser: 0.68 mg/dL (ref 0.40–1.20)
GFR: 96.14 mL/min (ref >60.00)
Glucose, Bld: 119 mg/dL — ABNORMAL HIGH (ref 70–99)
Potassium: 4.2 mEq/L (ref 3.5–5.1)
Sodium: 138 mEq/L (ref 135–145)
Total Bilirubin: 0.3 mg/dL (ref 0.2–1.2)
Total Protein: 7.4 g/dL (ref 6.0–8.3)

## 2015-09-15 ENCOUNTER — Telehealth: Payer: Self-pay | Admitting: Nurse Practitioner

## 2015-09-15 ENCOUNTER — Other Ambulatory Visit: Payer: Self-pay | Admitting: Nurse Practitioner

## 2015-09-15 MED ORDER — BENZONATATE 100 MG PO CAPS: 100.0000 mg | ORAL_CAPSULE | Freq: Two times a day (BID) | ORAL | Status: DC | PRN

## 2015-09-15 MED ORDER — METHYLPREDNISOLONE 4 MG PO TABS: ORAL_TABLET | ORAL | Status: DC

## 2015-09-15 NOTE — Telephone Encounter (Signed)
Left message giving patient instructions. 

## 2015-09-15 NOTE — Telephone Encounter (Signed)
Pt called about still not better from the upper respiratory. Pt has some antibiotic left and she does not feel like it's working. Pt wants to know if she can have something stronger? Pt wants to also know if she can have something called in for her cough, pt recommends if she can have the pills instead of the syrup?  Pharmacy is [facility] [address] - [place], [place] - [id number]

## 2015-09-15 NOTE — Telephone Encounter (Signed)
Will you call pt and let her know that since the antibiotic is not helpful, it is not bacterial. I will call in a 6 day prednisone taper and tessalon Perles for her coughing. Bronchitis coughs usually last 3-4 weeks. Call us if anything changes and visit urgent care if worsening over the holiday.

## 2015-09-20 ENCOUNTER — Encounter: Payer: Self-pay | Admitting: Nurse Practitioner

## 2015-09-20 DIAGNOSIS — J309 Allergic rhinitis, unspecified: Secondary | ICD-10-CM | POA: Insufficient documentation

## 2015-09-20 DIAGNOSIS — I10 Essential (primary) hypertension: Secondary | ICD-10-CM | POA: Insufficient documentation

## 2015-09-20 DIAGNOSIS — D649 Anemia, unspecified: Secondary | ICD-10-CM | POA: Insufficient documentation

## 2015-09-20 NOTE — Assessment & Plan Note (Signed)
Patient is feeling poorly and has 6 days of symptoms. She appears in accordance with her symptoms. Will treat with doxycyline, Proair HFA (wheezing) and follow up as needed. Encouraged probiotics.

## 2015-09-20 NOTE — Assessment & Plan Note (Signed)
BP Readings from Last 3 Encounters:  09/08/15 169/86  12/31/14 128/72  08/13/14 150/86   Uncontrolled today and pt is feeling ill. Will obtain CBC w/ diff and CMET since none in awhile.

## 2015-11-13 ENCOUNTER — Ambulatory Visit: Payer: BLUE CROSS/BLUE SHIELD | Admitting: Family Medicine

## 2015-11-16 ENCOUNTER — Ambulatory Visit (INDEPENDENT_AMBULATORY_CARE_PROVIDER_SITE_OTHER): Payer: BLUE CROSS/BLUE SHIELD | Admitting: Family Medicine

## 2015-11-16 ENCOUNTER — Other Ambulatory Visit: Payer: Self-pay | Admitting: Family Medicine

## 2015-11-16 ENCOUNTER — Encounter: Payer: Self-pay | Admitting: Family Medicine

## 2015-11-16 VITALS — BP 142/100 | HR 99 | Temp 98.0°F | Ht 61.5 in | Wt 195.1 lb

## 2015-11-16 DIAGNOSIS — N3000 Acute cystitis without hematuria: Secondary | ICD-10-CM | POA: Insufficient documentation

## 2015-11-16 DIAGNOSIS — N3001 Acute cystitis with hematuria: Secondary | ICD-10-CM

## 2015-11-16 LAB — POC URINALSYSI DIPSTICK (AUTOMATED)
Glucose, UA: 100
Nitrite, UA: POSITIVE
PROTEIN UA: 100
SPEC GRAV UA: 1.02
UROBILINOGEN UA: 4
pH, UA: 5

## 2015-11-16 MED ORDER — NITROFURANTOIN MONOHYD MACRO 100 MG PO CAPS: 100.0000 mg | ORAL_CAPSULE | Freq: Two times a day (BID) | ORAL | Status: DC

## 2015-11-16 MED ORDER — METOPROLOL TARTRATE 100 MG PO TABS: 100.0000 mg | ORAL_TABLET | Freq: Every day | ORAL | Status: DC

## 2015-11-16 NOTE — Progress Notes (Signed)
Subjective:  Patient ID: Ashlee Bass, female    DOB: [birth year]  Age: 55 y.o. MRN: [id number]  CC: ? UTI  HPI:  55 year old female presents to clinic today with complaints of possible UTI.  Patient states that since Friday she's been experiencing burning with urination as well as urinary frequency. No associated fever, flank pain, nausea, vomiting, abdominal pain. She has reported that she's had chills a few times. She reports that she has had some improvement with Azo. No exacerbating factors.   Social Hx   Social History   Social History  . Marital Status: Married    Spouse Name: Richard  . Number of Children: 2  . Years of Education: 10   Occupational History  . Arts administrator   Social History Bass Topics  . Smoking status: Never Smoker   . Smokeless tobacco: Never Used  . Alcohol Use: No  . Drug Use: No  . Sexual Activity: Not Asked   Other Topics Concern  . None   Social History Narrative   Ceairra grew up in [place], [place]. She currently lives at home with her husband of 31 years. They have a son and daughter. She recently became a grandmother (granddaughter). 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 She enjoys spending time with her friends. She also enjoys going out to dinner and watching movies.   Review of Systems  Constitutional: Negative for fever.  Gastrointestinal: Negative for abdominal pain.  Genitourinary: Positive for dysuria and frequency.   Objective:  BP 142/100 mmHg  Pulse 99  Temp(Src) 98 F (36.7 C) (Oral)  Ht 5' 1.5" (1.562 m)  Wt 195 lb 2 oz (88.508 kg)  BMI 36.28 kg/m2  SpO2 98%  BP/Weight 11/16/2015 09/08/2015 12/31/2014  Systolic BP 142 169 128  Diastolic BP 100 86 72  Wt. (Lbs) 195.13 196.4 190  BMI 36.28 36.51 35.32   Physical Exam  Constitutional: She appears well-developed. No distress.  Cardiovascular: Normal rate and regular rhythm.   Murmur heard. Pulmonary/Chest: Effort normal. No  respiratory distress. She has no wheezes. She has no rales.  Abdominal: Soft. She exhibits no distension. There is no tenderness. There is no rebound and no guarding.  Neurological: She is alert.  Psychiatric: She has a normal mood and affect.  Vitals reviewed.  Lab Results  Component Value Date   WBC 9.5 09/08/2015   HGB 13.6 09/08/2015   HCT 42.5 09/08/2015   PLT 307.0 09/08/2015   GLUCOSE 119* 09/08/2015   CHOL 239* 05/29/2014   TRIG 89.0 05/29/2014   HDL 45.60 05/29/2014   LDLCALC 176* 05/29/2014   ALT 24 09/08/2015   AST 26 09/08/2015   NA 138 09/08/2015   K 4.2 09/08/2015   CL 103 09/08/2015   CREATININE 0.68 09/08/2015   BUN 9 09/08/2015   CO2 30 09/08/2015   TSH 3.27 05/29/2014   Results for orders placed or performed in visit on 11/16/15 (from the past 24 hour(s))  POCT Urinalysis Dipstick (Automated)     Status: Abnormal   Collection Time: 11/16/15  2:59 PM  Result Value Ref Range   Color, UA orange    Clarity, UA clear    Glucose, UA 100    Bilirubin, UA small    Ketones, UA trace    Spec Grav, UA 1.020    Blood, UA small    pH, UA 5.0    Protein, UA 100    Urobilinogen, UA 4.0  Nitrite, UA pos    Leukocytes, UA large (3+) (A) Negative   Assessment & Plan:   Problem List Items Addressed This Visit    Acute cystitis - Primary    New problem. History and urinalysis consistent with UTI. Given severe allergy to penicillin, treating with Macrobid.      Relevant Medications   nitrofurantoin, macrocrystal-monohydrate, (MACROBID) 100 MG capsule   Other Relevant Orders   POCT Urinalysis Dipstick (Automated) (Completed)      Meds ordered this encounter  Medications  . metoprolol (LOPRESSOR) 100 MG tablet    Sig: Take 1 tablet (100 mg total) by mouth daily.    Dispense:  90 tablet    Refill:  3  . nitrofurantoin, macrocrystal-monohydrate, (MACROBID) 100 MG capsule    Sig: Take 1 capsule (100 mg total) by mouth 2 (two) times daily.    Dispense:  10  capsule    Refill:  0    Follow-up: PRN  Everlene Other DO [facility]

## 2015-11-16 NOTE — Progress Notes (Signed)
Pre visit review using our clinic review tool, if applicable. No additional management support is needed unless otherwise documented below in the visit note. 

## 2015-11-16 NOTE — Patient Instructions (Signed)
Take the antibiotic as prescribed.  Call if you worsen or fail to improve.  Take care  Dr.Boykin Baetz  

## 2015-11-16 NOTE — Assessment & Plan Note (Signed)
New problem. History and urinalysis consistent with UTI. Given severe allergy to penicillin, treating with Macrobid.

## 2015-12-29 ENCOUNTER — Telehealth: Payer: Self-pay | Admitting: Nurse Practitioner

## 2015-12-29 NOTE — Telephone Encounter (Signed)
Notified pt that rx was filled 10/2015

## 2015-12-29 NOTE — Telephone Encounter (Signed)
Pt called about needing a refill for metoprolol (LOPRESSOR) 100 MG tablet. Pharmacy is [facility] [address] - [place], [place] - [id number]. Call pt @ [phone]. Thank you!

## 2016-01-05 ENCOUNTER — Other Ambulatory Visit: Payer: Self-pay | Admitting: Nurse Practitioner

## 2016-01-05 DIAGNOSIS — Z1231 Encounter for screening mammogram for malignant neoplasm of breast: Secondary | ICD-10-CM

## 2016-02-04 ENCOUNTER — Ambulatory Visit: Payer: BLUE CROSS/BLUE SHIELD

## 2016-02-05 ENCOUNTER — Ambulatory Visit
Admission: RE | Admit: 2016-02-05 | Discharge: 2016-02-05 | Disposition: A | Discharge status: Home / Self Care | Payer: BLUE CROSS/BLUE SHIELD | Source: Ambulatory Visit | Attending: Nurse Practitioner | Admitting: Nurse Practitioner

## 2016-02-05 DIAGNOSIS — Z1231 Encounter for screening mammogram for malignant neoplasm of breast: Secondary | ICD-10-CM

## 2016-08-03 ENCOUNTER — Ambulatory Visit (INDEPENDENT_AMBULATORY_CARE_PROVIDER_SITE_OTHER): Payer: BLUE CROSS/BLUE SHIELD | Admitting: Family

## 2016-08-03 ENCOUNTER — Ambulatory Visit (INDEPENDENT_AMBULATORY_CARE_PROVIDER_SITE_OTHER): Payer: BLUE CROSS/BLUE SHIELD

## 2016-08-03 ENCOUNTER — Encounter: Payer: Self-pay | Admitting: Family

## 2016-08-03 VITALS — BP 154/80 | HR 99 | Temp 98.1°F | Wt 197.4 lb

## 2016-08-03 DIAGNOSIS — J209 Acute bronchitis, unspecified: Secondary | ICD-10-CM | POA: Diagnosis not present

## 2016-08-03 DIAGNOSIS — J45909 Unspecified asthma, uncomplicated: Secondary | ICD-10-CM | POA: Insufficient documentation

## 2016-08-03 DIAGNOSIS — R05 Cough: Secondary | ICD-10-CM | POA: Diagnosis not present

## 2016-08-03 MED ORDER — PREDNISONE 10 MG PO TABS: ORAL_TABLET | ORAL | 0 refills | Status: DC

## 2016-08-03 MED ORDER — PROMETHAZINE-DM 6.25-15 MG/5ML PO SYRP: 5.0000 mL | ORAL_SOLUTION | Freq: Every evening | ORAL | 1 refills | Status: DC | PRN

## 2016-08-03 NOTE — Patient Instructions (Addendum)
Please make an appointment to establish care. You are also overdue for your physical.  Chest x-ray does not show bacterial infection. You may start her prednisone.  BP log; goal < 140/90  Increase intake of clear fluids. Congestion is best treated by hydration, when mucus is wetter, it is thinner, less sticky, and easier to expel from the body, either through coughing up drainage, or by blowing your nose.   Get plenty of rest.   Use saline nasal drops and blow your nose frequently. Run a humidifier at night and elevate the head of the bed. Vicks Vapor rub will help with congestion and cough. Steam showers and sinus massage for congestion.   Use Acetaminophen or Ibuprofen as needed for fever or pain. Avoid second hand smoke. Even the smallest exposure will worsen symptoms.   Over the counter medications you can try include Delsym for cough, a decongestant for congestion, and Mucinex or Robitussin as an expectorant. Be sure to just get the plain Mucinex or Robitussin that just has one medication (Guaifenesen). We don't recommend the combination products. Note, be sure to drink two glasses of water with each dose of Mucinex as the medication will not work well without adequate hydration.   You can also try a teaspoon of honey to see if this will help reduce cough. Throat lozenges can sometimes be beneficial as well.    This illness will typically last 7 - 10 days.   Please follow up with our clinic if you develop a fever greater than 101 F, symptoms worsen, or do not resolve in the next week.

## 2016-08-03 NOTE — Progress Notes (Signed)
Subjective:    Patient ID: Ashlee Bass, female    DOB: 01/05/1961, 55 y.o.   MRN: [id number]  CC: Ashlee MainRobin Squires Bass is a 55 y.o. female who presents today for an acute visit.    HPI: Patient here for acute visit with chief complaint of dry cough for one week, worsening. Worse at night. No fever. Has been using her inhaler. 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  OTC cough syrup and flonase with little relief. No fever. Occasionally hears 'rattle.' states 'sometimes SOB' with exercise and with viral URI. Denies exertional chest pain or pressure, numbness or tingling radiating to left arm or jaw, palpitations, dizziness, frequent headaches, changes in vision.    H/o asthma triggered by Seasonal allergies.   Patient has not been taking her metoprolol the past few days when she has not felt well.     HISTORY:  Past Medical History:  Diagnosis Date  . Allergy   . Asthma   . GERD (gastroesophageal reflux disease)   . Hyperlipidemia   . Hypertension   . Kidney stones   . Urinary tract infection    History reviewed. No pertinent surgical history. Family History  Problem Relation Age of Onset  . Arthritis Mother   . Hyperlipidemia Mother   . Heart disease Mother     Heart Failure  . Hypertension Mother   . Diabetes Mother   . COPD Mother   . Arthritis Father   . Heart disease Father   . Hyperlipidemia Father   . Hypertension Father   . Kidney disease Father     ESRD - dialysis  . Arthritis Paternal Grandmother     Allergies: Codeine; Penicillins; Telithromycin; and Azithromycin Current Outpatient Prescriptions on File Prior to Visit  Medication Sig Dispense Refill  . fluticasone (FLONASE) 50 MCG/ACT nasal spray Place 2 sprays into both nostrils daily. 16 g 6  . metoprolol (LOPRESSOR) 100 MG tablet Take 1 tablet (100 mg total) by mouth daily. 90 tablet 3  . omeprazole (PRILOSEC) 20 MG capsule Take by mouth.    Marland Kitchen.  PROAIR HFA 108 (90 BASE) MCG/ACT inhaler Inhale 2 puffs into the lungs every 6 (six) hours as needed for wheezing or shortness of breath. 1 Inhaler 3  . zolpidem (AMBIEN) 5 MG tablet Take 1 tablet (5 mg total) by mouth at bedtime as needed. for sleep 30 tablet 1   No current facility-administered medications on file prior to visit.     Social History  Substance Use Topics  . Smoking status: Never Smoker  . Smokeless tobacco: Never Used  . Alcohol use No    Review of Systems  Constitutional: Negative for chills and fever.  HENT: Negative for congestion.   Respiratory: Positive for cough, shortness of breath and wheezing.   Cardiovascular: Negative for chest pain and palpitations.  Gastrointestinal: Negative for nausea and vomiting.      Objective:    BP (!) 154/80   Pulse 99   Temp 98.1 F (36.7 C) (Oral)   Wt 197 lb 6.4 oz (89.5 kg)   SpO2 95%   BMI 36.69 kg/m    Physical Exam  Constitutional: She appears well-developed and well-nourished.  HENT:  Head: Normocephalic and atraumatic.  Right Ear: Hearing, tympanic membrane, external ear and ear canal normal. No drainage, swelling or tenderness. No foreign bodies. Tympanic membrane is not erythematous and not bulging. No middle ear effusion. No decreased hearing is  noted.  Left Ear: Hearing, tympanic membrane, external ear and ear canal normal. No drainage, swelling or tenderness. No foreign bodies. Tympanic membrane is not erythematous and not bulging.  No middle ear effusion. No decreased hearing is noted.  Nose: Nose normal. No rhinorrhea. Right sinus exhibits no maxillary sinus tenderness and no frontal sinus tenderness. Left sinus exhibits no maxillary sinus tenderness and no frontal sinus tenderness.  Mouth/Throat: Uvula is midline, oropharynx is clear and moist and mucous membranes are normal. No oropharyngeal exudate, posterior oropharyngeal edema, posterior oropharyngeal erythema or tonsillar abscesses.  Eyes:  Conjunctivae are normal.  Cardiovascular: Regular rhythm, normal heart sounds and normal pulses.   Pulmonary/Chest: Effort normal and breath sounds normal. She has no wheezes. She has no rhonchi. She has no rales.  Lymphadenopathy:       Head (right side): No submental, no submandibular, no tonsillar, no preauricular, no posterior auricular and no occipital adenopathy present.       Head (left side): No submental, no submandibular, no tonsillar, no preauricular, no posterior auricular and no occipital adenopathy present.    She has no cervical adenopathy.  Neurological: She is alert.  Skin: Skin is warm and dry.  Psychiatric: She has a normal mood and affect. Her speech is normal and behavior is normal. Thought content normal.  Vitals reviewed.      Assessment & Plan:    Acute bronchitis, unspecified organism Suspect viral bronchitis which has affected asthma. No fever or focal infiltrate seen on CXR to suggest PNA. Patient and I agreed on short course of prednisone. Return precautions given.  Patient will keep BP log and take metoprolol. we will follow.   - DG Chest 2 View - predniSONE (DELTASONE) 10 MG tablet; Take 40 mg by mouth on day 1, then taper 10 mg daily until gone  Dispense: 10 tablet; Refill: 0 - promethazine-dextromethorphan (PROMETHAZINE-DM) 6.25-15 MG/5ML syrup; Take 5 mLs by mouth at bedtime as needed for cough.  Dispense: 40 mL; Refill: 1    I have discontinued Ms. Mckamie's nitrofurantoin (macrocrystal-monohydrate). I am also having her start on predniSONE and promethazine-dextromethorphan. Additionally, I am having her maintain her omeprazole, fluticasone, PROAIR HFA, zolpidem, and metoprolol.   Meds ordered this encounter  Medications  . predniSONE (DELTASONE) 10 MG tablet    Sig: Take 40 mg by mouth on day 1, then taper 10 mg daily until gone    Dispense:  10 tablet    Refill:  0    Order Specific Question:   Supervising Provider    Answer:   Duncan Dull L  [[id number]]  . promethazine-dextromethorphan (PROMETHAZINE-DM) 6.25-15 MG/5ML syrup    Sig: Take 5 mLs by mouth at bedtime as needed for cough.    Dispense:  40 mL    Refill:  1    Order Specific Question:   Supervising Provider    Answer:   Sherlene Shams [[id number]]    Return precautions given.   Risks, benefits, and alternatives of the medications and treatment plan prescribed today were discussed, and patient expressed understanding.   Education regarding symptom management and diagnosis given to patient on AVS.  Continue to follow with Carollee Leitz, NP for routine health maintenance.   Ashlee Main and I agreed with plan.   Rennie Plowman, FNP

## 2016-08-16 ENCOUNTER — Encounter: Payer: Self-pay | Admitting: Family

## 2016-08-16 ENCOUNTER — Ambulatory Visit (INDEPENDENT_AMBULATORY_CARE_PROVIDER_SITE_OTHER): Payer: BLUE CROSS/BLUE SHIELD | Admitting: Family

## 2016-08-16 VITALS — BP 160/80 | HR 98 | Temp 98.2°F | Ht 62.0 in | Wt 200.0 lb

## 2016-08-16 DIAGNOSIS — M25562 Pain in left knee: Secondary | ICD-10-CM

## 2016-08-16 DIAGNOSIS — G4700 Insomnia, unspecified: Secondary | ICD-10-CM

## 2016-08-16 DIAGNOSIS — J209 Acute bronchitis, unspecified: Secondary | ICD-10-CM | POA: Diagnosis not present

## 2016-08-16 DIAGNOSIS — I10 Essential (primary) hypertension: Secondary | ICD-10-CM

## 2016-08-16 DIAGNOSIS — Z0000 Encounter for general adult medical examination without abnormal findings: Secondary | ICD-10-CM | POA: Insufficient documentation

## 2016-08-16 DIAGNOSIS — G8929 Other chronic pain: Secondary | ICD-10-CM

## 2016-08-16 DIAGNOSIS — Z7689 Persons encountering health services in other specified circumstances: Secondary | ICD-10-CM | POA: Diagnosis not present

## 2016-08-16 MED ORDER — HYDROXYZINE HCL 50 MG PO TABS: 50.0000 mg | ORAL_TABLET | Freq: Every evening | ORAL | 2 refills | Status: DC | PRN

## 2016-08-16 MED ORDER — METOPROLOL TARTRATE 100 MG PO TABS: 100.0000 mg | ORAL_TABLET | Freq: Every day | ORAL | 3 refills | Status: DC

## 2016-08-16 MED ORDER — DOXYCYCLINE HYCLATE 100 MG PO TABS: 100.0000 mg | ORAL_TABLET | Freq: Two times a day (BID) | ORAL | 0 refills | Status: DC

## 2016-08-16 MED ORDER — LISINOPRIL 5 MG PO TABS: 5.0000 mg | ORAL_TABLET | Freq: Every day | ORAL | 3 refills | Status: DC

## 2016-08-16 NOTE — Assessment & Plan Note (Signed)
Chronic. Patient desires referral to orthopedic so she can resume an exercise program.  referral placed.

## 2016-08-16 NOTE — Assessment & Plan Note (Signed)
Reviewed past medical social and family history with patient. Patient will bring records of colonoscopy and Pap smear as I am unable to see these. Patient will follow-up with CPE in the next couple of months.

## 2016-08-16 NOTE — Patient Instructions (Addendum)
Need records of colonoscopy and last pap.  Please call GYN and ensure you are not due for Pap.  I suspect that your infection is viral in nature.  As discussed, I advise that you wait to fill the antibiotic after 1-2 days of symptom management to see if your symptoms improve. If you do not show improvement, you may take the antibiotic as prescribed.   Trial of atarax   New medication for blood pressure. Lab appt in one week.   Sleep study  Follow up in one month.

## 2016-08-16 NOTE — Progress Notes (Signed)
Subjective:    Patient ID: Ashlee Bass, female    DOB: 1960/12/19, 55 y.o.   MRN: [id number]  CC: Ashlee MainRobin Bass Colin is a 55 y.o. female who presents today for follow up.   HPI: Patient for follow-up and to establish care.  Insomnia- Hasn't been on Ambien for past several months and not sleeping well. Started a couple of years ago when started menopause. A lot of times would take half a pill. Trouble both falling asleep and staying asleep. Trying to limit caffeine. Some nights 'restless, figits.' Goes to bed at various times. No anxiety or depression.   Patient was seen 2 weeks ago for bronchitis and treated with prednisone, promethazine DM with no relief. At that time chest x-ray was clear. She reports she still has a  Dry cough. Some relief with Nyquil.Unsure if has decongestant component Cough worse at night. Occasional wheeze which improves with inhaler. Ashlee Bass.   HTN- running 160/80. HR has been running in 90's at home. Has been told in past that she had 'extra beats.' Put on lopressor decade ago when she had daughter years ago. Denies exertional chest pain or pressure, numbness or tingling radiating to left arm or jaw, palpitations, dizziness, frequent headaches, changes in vision, or shortness of breath.   Chronic left knee pain and would like ortho consult.   Per patient,  had colonoscopy and EGD 5 years ago. Had one polpy removed per patient. Requesting records.  She is up-to-date on her mammogram. Pap smear a year ago with [facility].  Normal per patient; unable to see records.      HISTORY:  Past Medical History:  Diagnosis Date  . Allergy   . Asthma   . GERD (gastroesophageal reflux disease)   . Hyperlipidemia   . Hypertension   . Kidney stones   . Urinary tract infection    History reviewed. No pertinent surgical history. Family History  Problem Relation Age of Onset  . Arthritis Mother   . Hyperlipidemia Mother   . Heart disease Mother     Heart Failure   . Hypertension Mother   . Diabetes Mother   . COPD Mother   . Arthritis Father   . Heart disease Father   . Hyperlipidemia Father   . Hypertension Father   . Kidney disease Father     ESRD - dialysis  . Arthritis Paternal Grandmother     Allergies: Codeine; Penicillins; Telithromycin; and Azithromycin Current Outpatient Prescriptions on File Prior to Visit  Medication Sig Dispense Refill  . fluticasone (FLONASE) 50 MCG/ACT nasal spray Place 2 sprays into both nostrils daily. 16 g 6  . omeprazole (PRILOSEC) 20 MG capsule Take by mouth.    Ashlee Bass. PROAIR HFA 108 (90 BASE) MCG/ACT inhaler Inhale 2 puffs into the lungs every 6 (six) hours as needed for wheezing or shortness of breath. 1 Inhaler 3   No current facility-administered medications on file prior to visit.     Social History  Substance Use Topics  . Smoking status: Never Smoker  . Smokeless tobacco: Never Used  . Alcohol use No    Review of Systems  Constitutional: Negative for chills and fever.  HENT: Positive for congestion. Negative for rhinorrhea, sinus pressure, sneezing and sore throat.   Respiratory: Positive for cough, shortness of breath (with coughing) and wheezing.   Cardiovascular: Negative for chest pain and palpitations.  Gastrointestinal: Negative for nausea and vomiting.  Psychiatric/Behavioral: Positive for sleep disturbance.  Objective:    BP (!) 160/80   Pulse 98   Temp 98.2 F (36.8 C) (Oral)   Ht 5\' 2"  (1.575 m)   Wt 200 lb (90.7 kg)   SpO2 95%   BMI 36.58 kg/m  BP Readings from Last 3 Encounters:  08/16/16 (!) 160/80  08/03/16 (!) 154/80  11/16/15 (!) 142/100   Wt Readings from Last 3 Encounters:  08/16/16 200 lb (90.7 kg)  08/03/16 197 lb 6.4 oz (89.5 kg)  11/16/15 195 lb 2 oz (88.5 kg)    Physical Exam  Constitutional: She appears well-developed and well-nourished.  HENT:  Head: Normocephalic and atraumatic.  Right Ear: Hearing, tympanic membrane, external ear and ear canal  normal. No drainage, swelling or tenderness. No foreign bodies. Tympanic membrane is not erythematous and not bulging. No middle ear effusion. No decreased hearing is noted.  Left Ear: Hearing, tympanic membrane, external ear and ear canal normal. No drainage, swelling or tenderness. No foreign bodies. Tympanic membrane is not erythematous and not bulging.  No middle ear effusion. No decreased hearing is noted.  Nose: Nose normal. No rhinorrhea. Right sinus exhibits no maxillary sinus tenderness and no frontal sinus tenderness. Left sinus exhibits no maxillary sinus tenderness and no frontal sinus tenderness.  Mouth/Throat: Uvula is midline, oropharynx is clear and moist and mucous membranes are normal. No oropharyngeal exudate, posterior oropharyngeal edema, posterior oropharyngeal erythema or tonsillar abscesses.  Eyes: Conjunctivae are normal.  Cardiovascular: Regular rhythm, normal heart sounds and normal pulses.   Pulmonary/Chest: Effort normal and breath sounds normal. She has no wheezes. She has no rhonchi. She has no rales.  Lymphadenopathy:       Head (right side): No submental, no submandibular, no tonsillar, no preauricular, no posterior auricular and no occipital adenopathy present.       Head (left side): No submental, no submandibular, no tonsillar, no preauricular, no posterior auricular and no occipital adenopathy present.    She has no cervical adenopathy.  Neurological: She is alert.  Skin: Skin is warm and dry.  Psychiatric: She has a normal mood and affect. Her speech is normal and behavior is normal. Thought content normal.  Vitals reviewed.      Assessment & Plan:   Problem List Items Addressed This Visit      Cardiovascular and Mediastinum   Benign essential HTN    Elevated BP today. Suspect OTC decongestant have contributed to elevated heart rate today. EKG shows sinus tachycardia. No acute ischemia. No prior EKG to compare to . Patient will monitor heart rate and  blood pressure at home and will call us with results. Also advised her to stop all decongestants. Start lisinopril for better BP control. Pending CMP in one week.       Relevant Medications   metoprolol (LOPRESSOR) 100 MG tablet   lisinopril (PRINIVIL,ZESTRIL) 5 MG tablet   Other Relevant Orders   EKG 12-Lead (Completed)     Respiratory   Acute bronchitis    SaO2 95. Afebrile. Due to persistent symptoms, patient and I agreed that she would wait another day or 2 with symptomatic treatment and it does not start to improve, she may start doxycycline. Return precautions given.      Relevant Medications   doxycycline (VIBRA-TABS) 100 MG tablet     Other   Chronic pain of left knee    Chronic. Patient desires referral to orthopedic so she can resume an exercise program.  referral placed.      Relevant Orders  Ambulatory referral to Orthopedic Surgery   Insomnia    Trial of Atarax. Education regarding sleep hygiene.Had been on ambien in the past and we discussed the risks of this medication. Pending sleep study for reported snoring.  F/u one month.       Relevant Medications   hydrOXYzine (ATARAX/VISTARIL) 50 MG tablet   Other Relevant Orders   Nocturnal polysomnography (NPSG)   Encounter to establish care - Primary    Reviewed past medical social and family history with patient. Patient will bring records of colonoscopy and Pap smear as I am unable to see these. Patient will follow-up with CPE in the next couple of months.      Relevant Orders   CBC with Differential/Platelet   Comprehensive metabolic panel   Hemoglobin A1c   Lipid panel   TSH   VITAMIN D 25 Hydroxy (Vit-D Deficiency, Fractures)    Other Visit Diagnoses   None.      I have discontinued Ms. Boberg's zolpidem, predniSONE, and promethazine-dextromethorphan. I am also having her start on doxycycline, hydrOXYzine, and lisinopril. Additionally, I am having her maintain her omeprazole, fluticasone, PROAIR HFA,  and metoprolol.   Meds ordered this encounter  Medications  . doxycycline (VIBRA-TABS) 100 MG tablet    Sig: Take 1 tablet (100 mg total) by mouth 2 (two) times daily.    Dispense:  10 tablet    Refill:  0    Order Specific Question:   Supervising Provider    Answer:   Duncan Dull L [[id number]]  . metoprolol (LOPRESSOR) 100 MG tablet    Sig: Take 1 tablet (100 mg total) by mouth daily.    Dispense:  90 tablet    Refill:  3    Order Specific Question:   Supervising Provider    Answer:   Duncan Dull L [[id number]]  . hydrOXYzine (ATARAX/VISTARIL) 50 MG tablet    Sig: Take 1 tablet (50 mg total) by mouth at bedtime as needed for anxiety (insomnia). Take 30 to 60 minutes before bedtime.    Dispense:  30 tablet    Refill:  2    Order Specific Question:   Supervising Provider    Answer:   Duncan Dull L [[id number]]  . lisinopril (PRINIVIL,ZESTRIL) 5 MG tablet    Sig: Take 1 tablet (5 mg total) by mouth daily.    Dispense:  90 tablet    Refill:  3    Order Specific Question:   Supervising Provider    Answer:   Sherlene Shams [[id number]]    Return precautions given.   Risks, benefits, and alternatives of the medications and treatment plan prescribed today were discussed, and patient expressed understanding.   Education regarding symptom management and diagnosis given to patient on AVS.  Continue to follow with Carollee Leitz, NP for routine health maintenance.   Ashlee Main and I agreed with plan.   Rennie Plowman, FNP

## 2016-08-16 NOTE — Assessment & Plan Note (Addendum)
Elevated BP today. Suspect OTC decongestant have contributed to elevated heart rate today. EKG shows sinus tachycardia. No acute ischemia. No prior EKG to compare to . Patient will monitor heart rate and blood pressure at home and will call us with results. Also advised her to stop all decongestants. Start lisinopril for better BP control. Pending CMP in one week.

## 2016-08-16 NOTE — Assessment & Plan Note (Addendum)
SaO2 95. Afebrile. Due to persistent symptoms, patient and I agreed that she would wait another day or 2 with symptomatic treatment and it does not start to improve, she may start doxycycline. Return precautions given.

## 2016-08-16 NOTE — Assessment & Plan Note (Addendum)
Trial of Atarax. Education regarding sleep hygiene.Had been on ambien in the past and we discussed the risks of this medication. Pending sleep study for reported snoring.  F/u one month.

## 2016-08-23 ENCOUNTER — Other Ambulatory Visit (INDEPENDENT_AMBULATORY_CARE_PROVIDER_SITE_OTHER): Payer: BLUE CROSS/BLUE SHIELD

## 2016-08-23 DIAGNOSIS — Z7689 Persons encountering health services in other specified circumstances: Secondary | ICD-10-CM | POA: Diagnosis not present

## 2016-08-23 LAB — CBC WITH DIFFERENTIAL/PLATELET
BASOS PCT: 0.8 % (ref 0.0–3.0)
Basophils Absolute: 0.1 10*3/uL (ref 0.0–0.1)
EOS PCT: 5.4 % — AB (ref 0.0–5.0)
Eosinophils Absolute: 0.4 10*3/uL (ref 0.0–0.7)
HEMATOCRIT: 41.6 % (ref 36.0–46.0)
HEMOGLOBIN: 13.9 g/dL (ref 12.0–15.0)
LYMPHS PCT: 27.5 % (ref 12.0–46.0)
Lymphs Abs: 2.2 10*3/uL (ref 0.7–4.0)
MCHC: 33.4 g/dL (ref 30.0–36.0)
MCV: 84.3 fl (ref 78.0–100.0)
MONO ABS: 0.6 10*3/uL (ref 0.1–1.0)
Monocytes Relative: 7.1 % (ref 3.0–12.0)
Neutro Abs: 4.7 10*3/uL (ref 1.4–7.7)
Neutrophils Relative %: 59.2 % (ref 43.0–77.0)
Platelets: 358 10*3/uL (ref 150.0–400.0)
RBC: 4.93 Mil/uL (ref 3.87–5.11)
RDW: 14.8 % (ref 11.5–15.5)
WBC: 8 10*3/uL (ref 4.0–10.5)

## 2016-08-23 LAB — COMPREHENSIVE METABOLIC PANEL
ALBUMIN: 4.2 g/dL (ref 3.5–5.2)
ALT: 16 U/L (ref 0–35)
AST: 17 U/L (ref 0–37)
Alkaline Phosphatase: 90 U/L (ref 39–117)
BUN: 13 mg/dL (ref 6–23)
CALCIUM: 10.2 mg/dL (ref 8.4–10.5)
CHLORIDE: 106 meq/L (ref 96–112)
CO2: 29 mEq/L (ref 19–32)
Creatinine, Ser: 0.75 mg/dL (ref 0.40–1.20)
GFR: 85.55 mL/min (ref >60.00)
Glucose, Bld: 98 mg/dL (ref 70–99)
POTASSIUM: 4.6 meq/L (ref 3.5–5.1)
Sodium: 142 mEq/L (ref 135–145)
Total Bilirubin: 0.3 mg/dL (ref 0.2–1.2)
Total Protein: 7.3 g/dL (ref 6.0–8.3)

## 2016-08-23 LAB — LIPID PANEL
CHOLESTEROL: 247 mg/dL — AB (ref 0–200)
HDL: 38.7 mg/dL — AB (ref >39.00)
LDL Cholesterol: 177 mg/dL — ABNORMAL HIGH (ref 0–99)
NonHDL: 208.22
Total CHOL/HDL Ratio: 6
Triglycerides: 157 mg/dL — ABNORMAL HIGH (ref 0.0–149.0)
VLDL: 31.4 mg/dL (ref 0.0–40.0)

## 2016-08-23 LAB — VITAMIN D 25 HYDROXY (VIT D DEFICIENCY, FRACTURES): VITD: 23.85 ng/mL — ABNORMAL LOW (ref 30.00–100.00)

## 2016-08-23 LAB — TSH: TSH: 2.3 u[IU]/mL (ref 0.35–4.50)

## 2016-08-23 LAB — HEMOGLOBIN A1C: HEMOGLOBIN A1C: 6.2 % (ref 4.6–6.5)

## 2016-09-20 ENCOUNTER — Ambulatory Visit: Payer: BLUE CROSS/BLUE SHIELD | Admitting: Family

## 2016-09-20 DIAGNOSIS — Z0289 Encounter for other administrative examinations: Secondary | ICD-10-CM

## 2017-03-06 ENCOUNTER — Other Ambulatory Visit: Payer: Self-pay | Admitting: Family

## 2017-03-06 DIAGNOSIS — Z1231 Encounter for screening mammogram for malignant neoplasm of breast: Secondary | ICD-10-CM

## 2017-03-24 ENCOUNTER — Ambulatory Visit: Payer: BLUE CROSS/BLUE SHIELD

## 2017-04-07 ENCOUNTER — Ambulatory Visit
Admission: RE | Admit: 2017-04-07 | Discharge: 2017-04-07 | Disposition: A | Discharge status: Home / Self Care | Payer: BLUE CROSS/BLUE SHIELD | Source: Ambulatory Visit | Attending: Family | Admitting: Family

## 2017-04-07 DIAGNOSIS — Z1231 Encounter for screening mammogram for malignant neoplasm of breast: Secondary | ICD-10-CM | POA: Insufficient documentation

## 2017-06-02 ENCOUNTER — Telehealth: Payer: Self-pay | Admitting: Family

## 2017-06-02 NOTE — Telephone Encounter (Signed)
Pt called and is requesting a new order for a sleep study. Pt was set up to go but spouse got hurt at work and needed surgery and has been taking care of his since. Please advise, thank you!  Call pt @ [phone]

## 2017-06-02 NOTE — Telephone Encounter (Signed)
Please advise 

## 2017-09-03 DIAGNOSIS — J209 Acute bronchitis, unspecified: Secondary | ICD-10-CM | POA: Diagnosis not present

## 2017-09-03 DIAGNOSIS — B373 Candidiasis of vulva and vagina: Secondary | ICD-10-CM | POA: Diagnosis not present

## 2017-09-03 DIAGNOSIS — B9689 Other specified bacterial agents as the cause of diseases classified elsewhere: Secondary | ICD-10-CM | POA: Diagnosis not present

## 2017-09-03 DIAGNOSIS — J019 Acute sinusitis, unspecified: Secondary | ICD-10-CM | POA: Diagnosis not present

## 2017-10-14 ENCOUNTER — Other Ambulatory Visit: Payer: Self-pay | Admitting: Family

## 2017-10-14 DIAGNOSIS — I10 Essential (primary) hypertension: Secondary | ICD-10-CM

## 2017-11-03 ENCOUNTER — Other Ambulatory Visit (HOSPITAL_COMMUNITY)
Admission: RE | Admit: 2017-11-03 | Discharge: 2017-11-03 | Disposition: A | Discharge status: Home / Self Care | Payer: BLUE CROSS/BLUE SHIELD | Source: Ambulatory Visit | Attending: Internal Medicine | Admitting: Internal Medicine

## 2017-11-03 ENCOUNTER — Encounter: Payer: Self-pay | Admitting: Internal Medicine

## 2017-11-03 ENCOUNTER — Encounter: Payer: Self-pay | Admitting: Family

## 2017-11-03 ENCOUNTER — Ambulatory Visit (INDEPENDENT_AMBULATORY_CARE_PROVIDER_SITE_OTHER): Payer: BLUE CROSS/BLUE SHIELD | Admitting: Internal Medicine

## 2017-11-03 VITALS — BP 150/88 | HR 99 | Temp 98.4°F | Ht 61.0 in | Wt 196.5 lb

## 2017-11-03 DIAGNOSIS — Z1329 Encounter for screening for other suspected endocrine disorder: Secondary | ICD-10-CM | POA: Diagnosis not present

## 2017-11-03 DIAGNOSIS — R739 Hyperglycemia, unspecified: Secondary | ICD-10-CM | POA: Diagnosis not present

## 2017-11-03 DIAGNOSIS — R109 Unspecified abdominal pain: Secondary | ICD-10-CM

## 2017-11-03 DIAGNOSIS — R059 Cough, unspecified: Secondary | ICD-10-CM

## 2017-11-03 DIAGNOSIS — R05 Cough: Secondary | ICD-10-CM | POA: Diagnosis not present

## 2017-11-03 DIAGNOSIS — R0683 Snoring: Secondary | ICD-10-CM | POA: Diagnosis not present

## 2017-11-03 DIAGNOSIS — Z0000 Encounter for general adult medical examination without abnormal findings: Secondary | ICD-10-CM | POA: Diagnosis not present

## 2017-11-03 DIAGNOSIS — R0681 Apnea, not elsewhere classified: Secondary | ICD-10-CM | POA: Diagnosis not present

## 2017-11-03 DIAGNOSIS — E559 Vitamin D deficiency, unspecified: Secondary | ICD-10-CM | POA: Diagnosis not present

## 2017-11-03 DIAGNOSIS — G4700 Insomnia, unspecified: Secondary | ICD-10-CM | POA: Diagnosis not present

## 2017-11-03 DIAGNOSIS — J4530 Mild persistent asthma, uncomplicated: Secondary | ICD-10-CM

## 2017-11-03 DIAGNOSIS — I10 Essential (primary) hypertension: Secondary | ICD-10-CM

## 2017-11-03 DIAGNOSIS — R200 Anesthesia of skin: Secondary | ICD-10-CM

## 2017-11-03 DIAGNOSIS — Z1159 Encounter for screening for other viral diseases: Secondary | ICD-10-CM

## 2017-11-03 DIAGNOSIS — K219 Gastro-esophageal reflux disease without esophagitis: Secondary | ICD-10-CM

## 2017-11-03 DIAGNOSIS — Z124 Encounter for screening for malignant neoplasm of cervix: Secondary | ICD-10-CM | POA: Insufficient documentation

## 2017-11-03 MED ORDER — TRAZODONE HCL 50 MG PO TABS: 50.0000 mg | ORAL_TABLET | Freq: Every evening | ORAL | 2 refills | Status: DC | PRN

## 2017-11-03 MED ORDER — LOSARTAN POTASSIUM 25 MG PO TABS: 25.0000 mg | ORAL_TABLET | Freq: Every day | ORAL | 0 refills | Status: DC

## 2017-11-03 MED ORDER — FLUTICASONE-SALMETEROL 115-21 MCG/ACT IN AERO: 2.0000 | INHALATION_SPRAY | Freq: Two times a day (BID) | RESPIRATORY_TRACT | 2 refills | Status: DC

## 2017-11-03 NOTE — Patient Instructions (Addendum)
F/u in 1 month with PCP Labs fasting next week  Take care  Stop Lisinopril start Losartan 25 mg daily for blood pressure  Start Advair 2 puffs 2x per day rinse mouth  Start trazadone for sleep 1 hour before bed  If your abdominal pain comes back return to clinic    Insomnia Insomnia is a sleep disorder that makes it difficult to fall asleep or to stay asleep. Insomnia can cause tiredness (fatigue), low energy, difficulty concentrating, mood swings, and poor performance at work or school. There are three different ways to classify insomnia:  Difficulty falling asleep.  Difficulty staying asleep.  Waking up too early in the morning.  Any type of insomnia can be long-term (chronic) or short-term (acute). Both are common. Short-term insomnia usually lasts for three months or less. Chronic insomnia occurs at least three times a week for longer than three months. What are the causes? Insomnia may be caused by another condition, situation, or substance, such as:  Anxiety.  Certain medicines.  Gastroesophageal reflux disease (GERD) or other gastrointestinal conditions.  Asthma or other breathing conditions.  Restless legs syndrome, sleep apnea, or other sleep disorders.  Chronic pain.  Menopause. This may include hot flashes.  Stroke.  Abuse of alcohol, tobacco, or illegal drugs.  Depression.  Caffeine.  Neurological disorders, such as Alzheimer disease.  An overactive thyroid (hyperthyroidism).  The cause of insomnia may not be known. What increases the risk? Risk factors for insomnia include:  Gender. Women are more commonly affected than men.  Age. Insomnia is more common as you get older.  Stress. This may involve your professional or personal life.  Income. Insomnia is more common in people with lower income.  Lack of exercise.  Irregular work schedule or night shifts.  Traveling between different time zones.  What are the signs or symptoms? If you  have insomnia, trouble falling asleep or trouble staying asleep is the main symptom. This may lead to other symptoms, such as:  Feeling fatigued.  Feeling nervous about going to sleep.  Not feeling rested in the morning.  Having trouble concentrating.  Feeling irritable, anxious, or depressed.  How is this treated? Treatment for insomnia depends on the cause. If your insomnia is caused by an underlying condition, treatment will focus on addressing the condition. Treatment may also include:  Medicines to help you sleep.  Counseling or therapy.  Lifestyle adjustments.  Follow these instructions at home:  Take medicines only as directed by your health care provider.  Keep regular sleeping and waking hours. Avoid naps.  Keep a sleep diary to help you and your health care provider figure out what could be causing your insomnia. Include: ? When you sleep. ? When you wake up during the night. ? How well you sleep. ? How rested you feel the next day. ? Any side effects of medicines you are taking. ? What you eat and drink.  Make your bedroom a comfortable place where it is easy to fall asleep: ? Put up shades or special blackout curtains to block light from outside. ? Use a white noise machine to block noise. ? Keep the temperature cool.  Exercise regularly as directed by your health care provider. Avoid exercising right before bedtime.  Use relaxation techniques to manage stress. Ask your health care provider to suggest some techniques that may work well for you. These may include: ? Breathing exercises. ? Routines to release muscle tension. ? Visualizing peaceful scenes.  Cut back on alcohol,  caffeinated beverages, and cigarettes, especially close to bedtime. These can disrupt your sleep.  Do not overeat or eat spicy foods right before bedtime. This can lead to digestive discomfort that can make it hard for you to sleep.  Limit screen use before bedtime. This  includes: ? Watching TV. ? Using your smartphone, tablet, and computer.  Stick to a routine. This can help you fall asleep faster. Try to do a quiet activity, brush your teeth, and go to bed at the same time each night.  Get out of bed if you are still awake after 15 minutes of trying to sleep. Keep the lights down, but try reading or doing a quiet activity. When you feel sleepy, go back to bed.  Make sure that you drive carefully. Avoid driving if you feel very sleepy.  Keep all follow-up appointments as directed by your health care provider. This is important. Contact a health care provider if:  You are tired throughout the day or have trouble in your daily routine due to sleepiness.  You continue to have sleep problems or your sleep problems get worse. Get help right away if:  You have serious thoughts about hurting yourself or someone else. This information is not intended to replace advice given to you by your health care provider. Make sure you discuss any questions you have with your health care provider. Document Released: 10/07/2000 Document Revised: 03/11/2016 Document Reviewed: 07/11/2014 Elsevier Interactive Patient Education  [employer].

## 2017-11-07 ENCOUNTER — Telehealth: Payer: Self-pay | Admitting: Radiology

## 2017-11-07 ENCOUNTER — Encounter: Payer: Self-pay | Admitting: Internal Medicine

## 2017-11-07 DIAGNOSIS — R109 Unspecified abdominal pain: Secondary | ICD-10-CM | POA: Insufficient documentation

## 2017-11-07 DIAGNOSIS — R05 Cough: Secondary | ICD-10-CM | POA: Insufficient documentation

## 2017-11-07 DIAGNOSIS — G4733 Obstructive sleep apnea (adult) (pediatric): Secondary | ICD-10-CM | POA: Insufficient documentation

## 2017-11-07 DIAGNOSIS — R0683 Snoring: Secondary | ICD-10-CM | POA: Insufficient documentation

## 2017-11-07 DIAGNOSIS — R059 Cough, unspecified: Secondary | ICD-10-CM | POA: Insufficient documentation

## 2017-11-07 DIAGNOSIS — R200 Anesthesia of skin: Secondary | ICD-10-CM | POA: Insufficient documentation

## 2017-11-07 LAB — CYTOLOGY - PAP
BACTERIAL VAGINITIS: NEGATIVE
Candida vaginitis: NEGATIVE
DIAGNOSIS: NEGATIVE
HPV (WINDOPATH): NOT DETECTED

## 2017-11-07 NOTE — Progress Notes (Signed)
Chief Complaint  Patient presents with  . Annual Exam   Annual exam with multiple complaints  1 c/o cough, sob postnasal drip h/o asthma on proair she wants ventolin feels works better  2. BP elevated today no meds had today  3. C/o hoarse voice associated with #1 4. She c/o insomnia tried Melatonin, Ambien in the past did not like hydroxyzine  5. She c/o abdomin pain LLQ w/in the last week that was on and off since New Years and Thursday prior to visit had stool x 3 which was black now sx's resolved  6. C/o numbness in b/l ring fingers and at times sharp pain. These sx's last 2 hours denies neck pain advised consider neurology if sx's continue  7. OSA snoring and gasping for air when snores. Never did sleep study wants rescheduled.    Review of Systems  Constitutional: Negative for weight loss.  HENT: Negative for hearing loss.        +Hoarseness   Respiratory: Positive for cough and shortness of breath.   Cardiovascular: Negative for chest pain.  Gastrointestinal: Negative for abdominal pain and blood in stool.       Resolved ab pain, black stool   Genitourinary: Positive for frequency.  Musculoskeletal: Negative for neck pain.  Skin: Negative for rash.  Neurological: Positive for sensory change.       Numbness b/l ring fingers   Psychiatric/Behavioral: The patient has insomnia.    Past Medical History:  Diagnosis Date  . Allergy   . Asthma   . GERD (gastroesophageal reflux disease)   . Hyperlipidemia   . Hypertension   . Kidney stones   . Urinary tract infection    History reviewed. No pertinent surgical history. Family History  Problem Relation Age of Onset  . Arthritis Mother   . Hyperlipidemia Mother   . Heart disease Mother        Heart Failure  . Hypertension Mother   . Diabetes Mother   . COPD Mother   . Arthritis Father   . Heart disease Father   . Hyperlipidemia Father   . Hypertension Father   . Kidney disease Father        ESRD - dialysis  .  Arthritis Paternal Grandmother   . Breast cancer Paternal Grandmother    Social History   Socioeconomic History  . Marital status: Married    Spouse name: Ashlee Bass  . Number of children: 2  . Years of education: 10  . Highest education level: Not on file  Social Needs  . Financial resource strain: Not on file  . Food insecurity - worry: Not on file  . Food insecurity - inability: Not on file  . Transportation needs - medical: Not on file  . Transportation needs - non-medical: Not on file  Occupational History  . Occupation: Data processing managerAssistant Teacher    Comment: [employer]  Tobacco Use  . Smoking status: Never Smoker  . Smokeless tobacco: Never Used  Substance and Sexual Activity  . Alcohol use: No    Alcohol/week: 0.0 oz  . Drug use: No  . Sexual activity: Not on file  Other Topics Concern  . Not on file  Social History Narrative   Ashlee BallRobin [place] She currently lives at home with her husband of 31 years. They have a son and daughter. She recently became a grandmother (granddaughter). 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 She enjoys spending time with her friends. She also enjoys  going out to dinner and watching movies.   Current Meds  Medication Sig  . fluticasone (FLONASE) 50 MCG/ACT nasal spray Place 2 sprays into both nostrils daily.  . metoprolol tartrate (LOPRESSOR) 100 MG tablet TAKE ONE TABLET BY MOUTH ONCE DAILY  . omeprazole (PRILOSEC) 20 MG capsule Take by mouth.  Marland Kitchen PROAIR HFA 108 (90 BASE) MCG/ACT inhaler Inhale 2 puffs into the lungs every 6 (six) hours as needed for wheezing or shortness of breath.  . [DISCONTINUED] lisinopril (PRINIVIL,ZESTRIL) 5 MG tablet TAKE ONE TABLET BY MOUTH ONCE DAILY  . [DISCONTINUED] omeprazole (PRILOSEC) 20 MG capsule Take by mouth.   Allergies  Allergen Reactions  . Codeine Nausea And Vomiting  . Penicillins Hives  . Telithromycin Other (See Comments)  . Azithromycin Itching and Rash   No results found for  this or any previous visit (from the past 2160 hour(s)). Objective  Body mass index is 37.13 kg/m. Wt Readings from Last 3 Encounters:  11/03/17 196 lb 8 oz (89.1 kg)  08/16/16 200 lb (90.7 kg)  08/03/16 197 lb 6.4 oz (89.5 kg)   Temp Readings from Last 3 Encounters:  11/03/17 98.4 F (36.9 C) (Oral)  08/16/16 98.2 F (36.8 C) (Oral)  08/03/16 98.1 F (36.7 C) (Oral)   BP Readings from Last 3 Encounters:  11/03/17 (!) 150/88  08/16/16 (!) 160/80  08/03/16 (!) 154/80   Pulse Readings from Last 3 Encounters:  11/03/17 99  08/16/16 98  08/03/16 99   O2 sat room air 97%  Physical Exam  Constitutional: She is oriented to person, place, and time and well-developed, well-nourished, and in no distress.  HENT:  Head: Normocephalic and atraumatic.  Mouth/Throat: Oropharynx is clear and moist and mucous membranes are normal.  Eyes: Conjunctivae are normal. Pupils are equal, round, and reactive to light.  Cardiovascular: Normal rate, regular rhythm and normal heart sounds.  Pulmonary/Chest: Effort normal and breath sounds normal. Right breast exhibits no inverted nipple, no mass, no nipple discharge, no skin change and no tenderness. Left breast exhibits no inverted nipple, no mass, no nipple discharge, no skin change and no tenderness. Breasts are symmetrical.  +cough on exam   Abdominal: Soft. Bowel sounds are normal. There is no tenderness.  Genitourinary: Uterus normal, cervix normal, right adnexa normal, left adnexa normal and vulva normal. Thick  odorless  white and vaginal discharge found.  Neurological: She is alert and oriented to person, place, and time. Gait normal. Gait normal.  Skin: Skin is warm, dry and intact.  Psychiatric: Mood, memory, affect and judgment normal.  Nursing note and vitals reviewed.   Assessment   1. Annual wellness  2. Cough could be related ACEI Lisinopril vs asthma uncontrolled also with h/o postnasal drip  3. HTN elevated no meds today  4.  Insomnia noted since menopause  5. Resolved loose black stools and LLQ ab pain and black stool  6. Numbness b/l ring fingers w/o neck pain ? Etiology  7. OSA with snoring  8.HM Plan  1. Breast exam and pap today pap with white discharge will send off  2. D/c ACEI. Start losartan 25 mg  Pt prefers ventolin inhaler  Will add advair 115/21 as well  Pt should take antihistamine daily as well post nasal drip  3. D/c ACEI add Losartan 25 cont lopressor tartrate she is only taking 100 1x per day though this is bid medication will have f/u with PCP  4. Add Trazadone for sleep 50 mg see other options  HPI which did not work  5. If returns RTC  6. Consider neurology in future  7. Re order sleep study  8. Had flu shot  Per pt ahd Tdap 5-6 years ago  Consider pna 23 vx if has not had  Former smoker quit 35 years ago smoked 24 years max <4 cig per day no FH lung cancer  Disc shingrix vaccine to consider in future   Pap and breast exam today  mammo had 04/07/17  DEXA 11/19/12 osteopenia need to disc calcium 600 mg bid and vit D at least 1000 IU qd in futuer  Colonoscopy had 09/30/14 per system care everywhere unable to see need to get record future [facility]   Labs next week   Provider: Dr. French Ana McLean-Scocuzza-[facility]

## 2017-11-07 NOTE — Telephone Encounter (Signed)
Pt of Margaret's that you seen on 11/03/17 coming in for labs tomorrow please place future orders. Thank you.

## 2017-11-08 ENCOUNTER — Other Ambulatory Visit (INDEPENDENT_AMBULATORY_CARE_PROVIDER_SITE_OTHER): Payer: BLUE CROSS/BLUE SHIELD

## 2017-11-08 DIAGNOSIS — R200 Anesthesia of skin: Secondary | ICD-10-CM

## 2017-11-08 DIAGNOSIS — R109 Unspecified abdominal pain: Secondary | ICD-10-CM

## 2017-11-08 DIAGNOSIS — R739 Hyperglycemia, unspecified: Secondary | ICD-10-CM

## 2017-11-08 DIAGNOSIS — Z1159 Encounter for screening for other viral diseases: Secondary | ICD-10-CM | POA: Diagnosis not present

## 2017-11-08 DIAGNOSIS — E559 Vitamin D deficiency, unspecified: Secondary | ICD-10-CM | POA: Diagnosis not present

## 2017-11-08 DIAGNOSIS — K219 Gastro-esophageal reflux disease without esophagitis: Secondary | ICD-10-CM | POA: Diagnosis not present

## 2017-11-08 DIAGNOSIS — Z1329 Encounter for screening for other suspected endocrine disorder: Secondary | ICD-10-CM

## 2017-11-08 DIAGNOSIS — I10 Essential (primary) hypertension: Secondary | ICD-10-CM | POA: Diagnosis not present

## 2017-11-08 LAB — CBC WITH DIFFERENTIAL/PLATELET
BASOS ABS: 0.1 10*3/uL (ref 0.0–0.1)
BASOS PCT: 1.2 % (ref 0.0–3.0)
EOS ABS: 0.3 10*3/uL (ref 0.0–0.7)
Eosinophils Relative: 4.6 % (ref 0.0–5.0)
HEMATOCRIT: 41.8 % (ref 36.0–46.0)
HEMOGLOBIN: 13.7 g/dL (ref 12.0–15.0)
LYMPHS PCT: 27.8 % (ref 12.0–46.0)
Lymphs Abs: 1.6 10*3/uL (ref 0.7–4.0)
MCHC: 32.7 g/dL (ref 30.0–36.0)
MCV: 86.9 fl (ref 78.0–100.0)
MONO ABS: 0.4 10*3/uL (ref 0.1–1.0)
Monocytes Relative: 7.7 % (ref 3.0–12.0)
Neutro Abs: 3.4 10*3/uL (ref 1.4–7.7)
Neutrophils Relative %: 58.7 % (ref 43.0–77.0)
Platelets: 343 10*3/uL (ref 150.0–400.0)
RBC: 4.81 Mil/uL (ref 3.87–5.11)
RDW: 14.9 % (ref 11.5–15.5)
WBC: 5.8 10*3/uL (ref 4.0–10.5)

## 2017-11-08 LAB — COMPREHENSIVE METABOLIC PANEL
ALBUMIN: 4.1 g/dL (ref 3.5–5.2)
ALT: 15 U/L (ref 0–35)
AST: 16 U/L (ref 0–37)
Alkaline Phosphatase: 85 U/L (ref 39–117)
BILIRUBIN TOTAL: 0.5 mg/dL (ref 0.2–1.2)
BUN: 13 mg/dL (ref 6–23)
CALCIUM: 10.1 mg/dL (ref 8.4–10.5)
CHLORIDE: 104 meq/L (ref 96–112)
CO2: 29 mEq/L (ref 19–32)
Creatinine, Ser: 0.76 mg/dL (ref 0.40–1.20)
GFR: 83.88 mL/min (ref >60.00)
Glucose, Bld: 99 mg/dL (ref 70–99)
Potassium: 4.1 mEq/L (ref 3.5–5.1)
SODIUM: 139 meq/L (ref 135–145)
TOTAL PROTEIN: 7.3 g/dL (ref 6.0–8.3)

## 2017-11-08 LAB — URINALYSIS, ROUTINE W REFLEX MICROSCOPIC
Bilirubin Urine: NEGATIVE
Hgb urine dipstick: NEGATIVE
Ketones, ur: NEGATIVE
Nitrite: NEGATIVE
Specific Gravity, Urine: 1.01 (ref 1.000–1.030)
Total Protein, Urine: NEGATIVE
Urine Glucose: NEGATIVE
Urobilinogen, UA: 0.2 (ref 0.0–1.0)
pH: 7.5 (ref 5.0–8.0)

## 2017-11-08 LAB — LIPID PANEL
CHOLESTEROL: 247 mg/dL — AB (ref 0–200)
HDL: 46.1 mg/dL (ref >39.00)
LDL CALC: 171 mg/dL — AB (ref 0–99)
NONHDL: 200.71
Total CHOL/HDL Ratio: 5
Triglycerides: 150 mg/dL — ABNORMAL HIGH (ref 0.0–149.0)
VLDL: 30 mg/dL (ref 0.0–40.0)

## 2017-11-08 LAB — VITAMIN D 25 HYDROXY (VIT D DEFICIENCY, FRACTURES): VITD: 23.52 ng/mL — ABNORMAL LOW (ref 30.00–100.00)

## 2017-11-08 LAB — HEMOGLOBIN A1C: Hgb A1c MFr Bld: 6.2 % (ref 4.6–6.5)

## 2017-11-08 LAB — T4, FREE: Free T4: 0.77 ng/dL (ref 0.60–1.60)

## 2017-11-08 LAB — TSH: TSH: 2.63 u[IU]/mL (ref 0.35–4.50)

## 2017-11-09 LAB — HEPATITIS B SURFACE ANTIBODY, QUANTITATIVE: Hepatitis B-Post: <5 m[IU]/mL — ABNORMAL LOW (ref >=10)

## 2017-11-09 LAB — HEPATITIS B SURFACE ANTIGEN: Hepatitis B Surface Ag: NONREACTIVE

## 2017-11-14 LAB — H PYLORI, IGM, IGG, IGA AB
H pylori, IgM Abs: <9 units (ref 0.0–8.9)
H. pylori, IgA Abs: <9 units (ref 0.0–8.9)

## 2017-11-23 ENCOUNTER — Other Ambulatory Visit: Payer: Self-pay | Admitting: Internal Medicine

## 2017-11-23 DIAGNOSIS — E559 Vitamin D deficiency, unspecified: Secondary | ICD-10-CM

## 2017-11-23 MED ORDER — CHOLECALCIFEROL 1.25 MG (50000 UT) PO CAPS: 50000.0000 [IU] | ORAL_CAPSULE | ORAL | 1 refills | Status: DC

## 2017-11-29 ENCOUNTER — Other Ambulatory Visit: Payer: Self-pay | Admitting: Internal Medicine

## 2017-11-29 DIAGNOSIS — E785 Hyperlipidemia, unspecified: Secondary | ICD-10-CM

## 2017-11-29 MED ORDER — ATORVASTATIN CALCIUM 20 MG PO TABS: 20.0000 mg | ORAL_TABLET | Freq: Every day | ORAL | 1 refills | Status: DC

## 2018-02-25 IMAGING — DX DG CHEST 2V
2 series · 2 of 2 positions shown · non-contrast
Comparison: No priors.

CLINICAL DATA: 55-year-old female with history of cough for 1 week.

EXAM:
CHEST  2 VIEW

[chest pa]
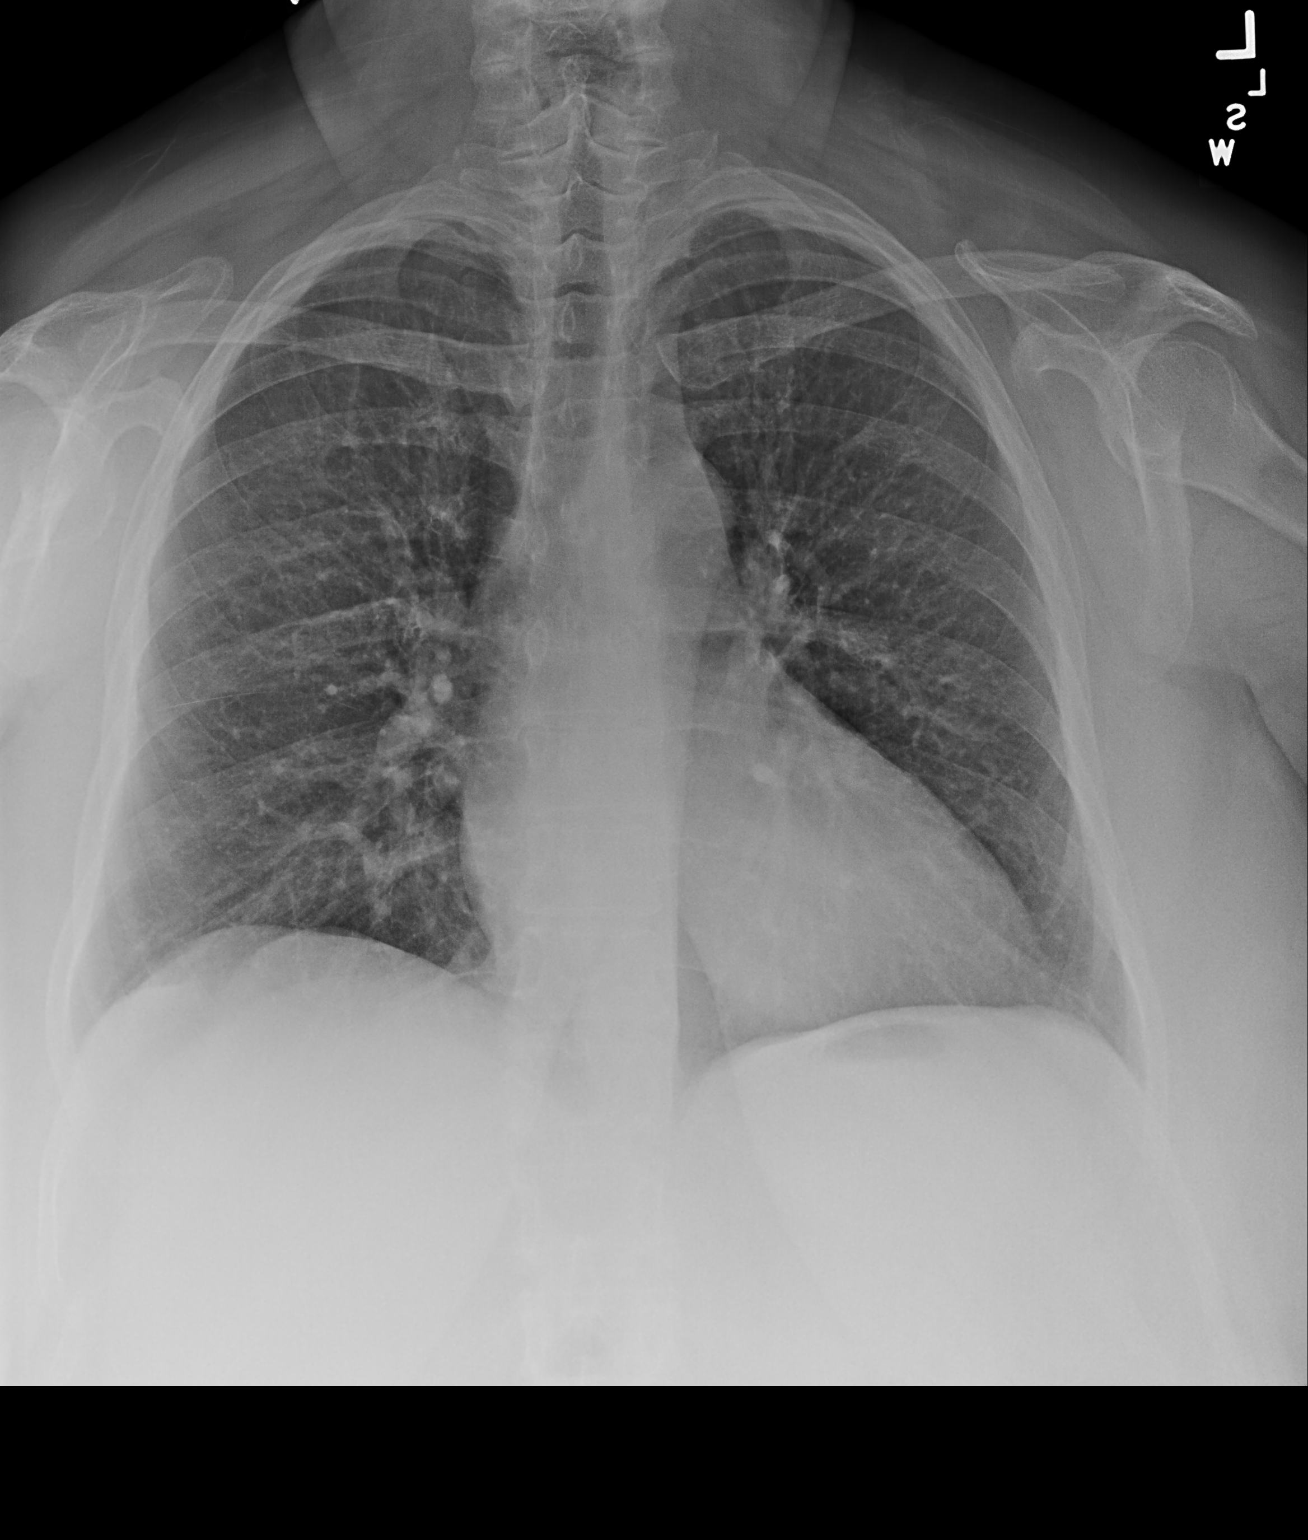

[chest lat]
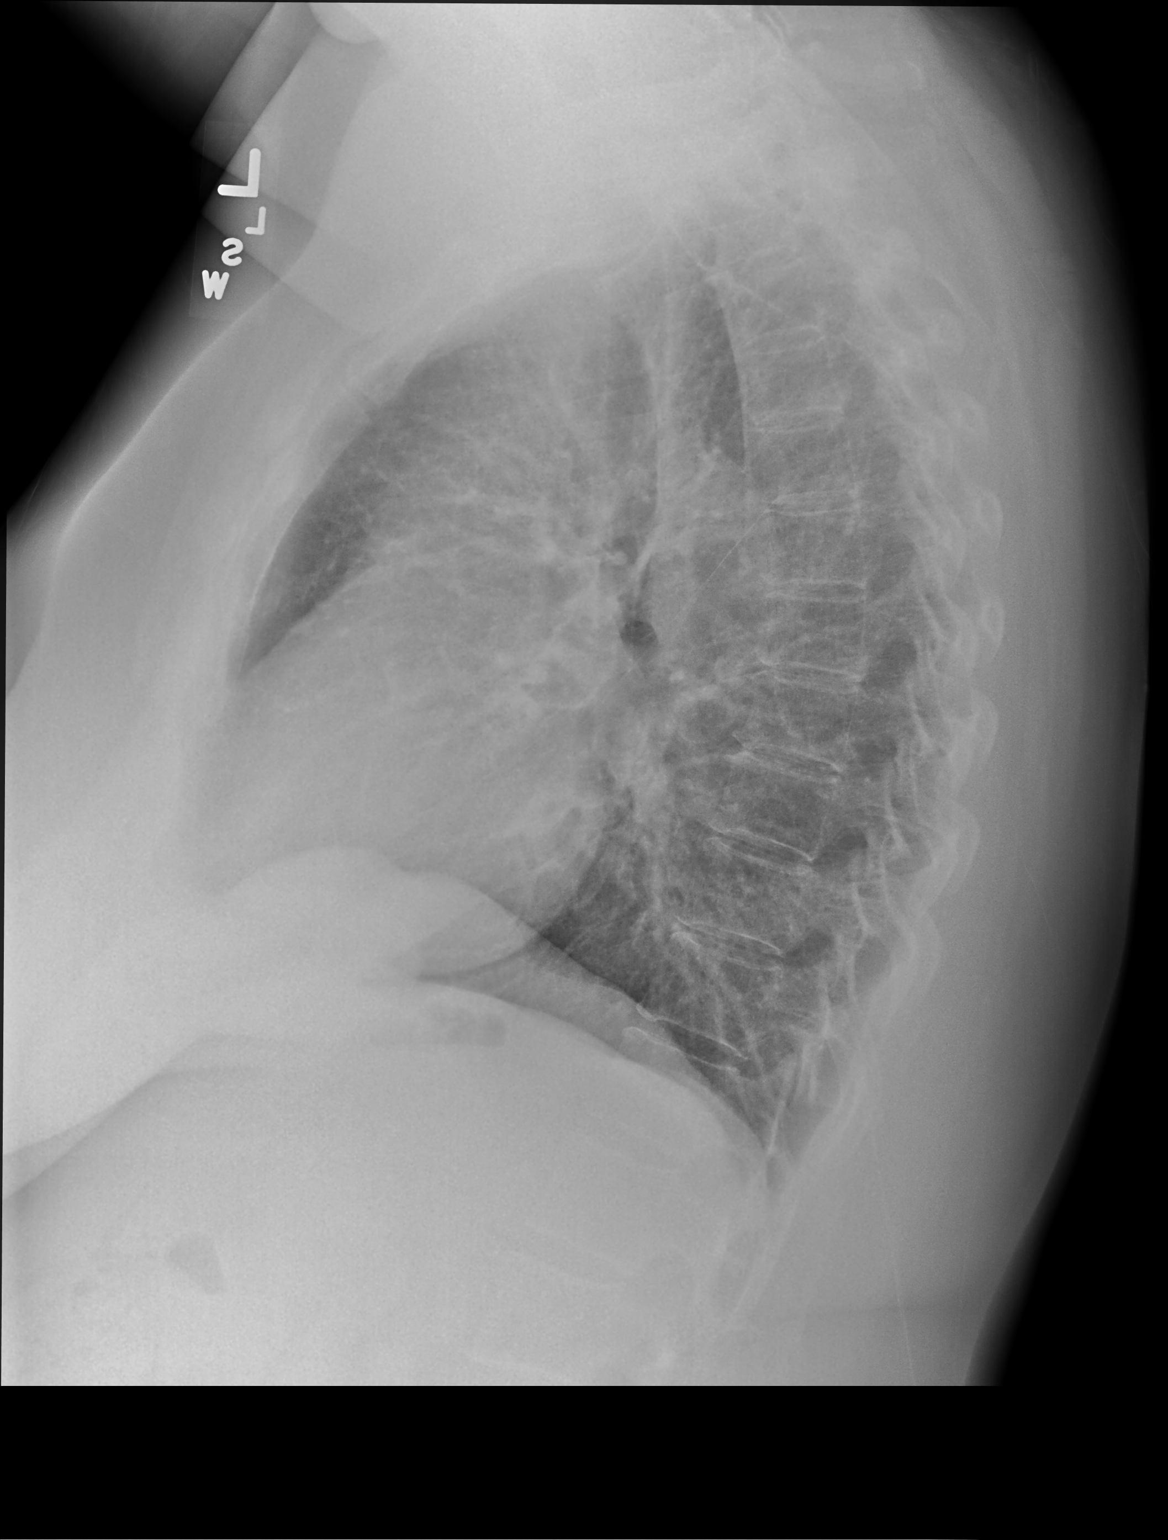

[2 of 2 positions shown; findings below may reference images not displayed]

FINDINGS: Mild diffuse peribronchial cuffing. Lung volumes are normal. No
consolidative airspace disease. No pleural effusions. No
pneumothorax. No pulmonary nodule or mass noted. Pulmonary
vasculature and the cardiomediastinal silhouette are within normal
limits.
IMPRESSION: 1. Mild diffuse peribronchial cuffing, suggestive of an acute
bronchitis.

## 2018-04-19 DIAGNOSIS — R0602 Shortness of breath: Secondary | ICD-10-CM | POA: Diagnosis not present

## 2018-04-19 DIAGNOSIS — G4733 Obstructive sleep apnea (adult) (pediatric): Secondary | ICD-10-CM | POA: Diagnosis not present

## 2018-04-20 DIAGNOSIS — G4733 Obstructive sleep apnea (adult) (pediatric): Secondary | ICD-10-CM | POA: Diagnosis not present

## 2018-04-20 DIAGNOSIS — R0602 Shortness of breath: Secondary | ICD-10-CM | POA: Diagnosis not present

## 2018-04-23 ENCOUNTER — Other Ambulatory Visit: Payer: Self-pay | Admitting: Family

## 2018-04-23 DIAGNOSIS — G4700 Insomnia, unspecified: Secondary | ICD-10-CM

## 2018-04-23 DIAGNOSIS — I10 Essential (primary) hypertension: Secondary | ICD-10-CM

## 2018-04-23 NOTE — Telephone Encounter (Signed)
Copied from CRM [phone]. Topic: Quick Communication - Rx Refill/Question >> Apr 23, 2018  3:00 PM Arlyss Gandyichardson, Custer Pimenta N, NT wrote: Medication: traZODone (DESYREL) 50 MG tablet and losartan (COZAAR) 25 MG tablet (pt states this one was recalled but pharamacy never heard if the dr wanted to change)  Has the patient contacted their pharmacy? Yes.   (Agent: If no, request that the patient contact the pharmacy for the refill.) (Agent: If yes, when and what did the pharmacy advise?)  Preferred Pharmacy (with phone number or street name):   [facility] [address] - [place], [place] - [id number] [phone] (Phone) [phone])      Agent: Please be advised that RX refills may take up to 3 business days. We ask that you follow-up with your pharmacy.

## 2018-04-24 MED ORDER — LOSARTAN POTASSIUM 25 MG PO TABS: 25.0000 mg | ORAL_TABLET | Freq: Every day | ORAL | 0 refills | Status: DC

## 2018-04-24 NOTE — Telephone Encounter (Signed)
[facility] called about recall on Losartan, [facility] Technician who says the patient will need a new prescription. She passed the phone to the pharmacist who advised the patient bring in her pill bottle, if she has pills, when the refill is ready to pick up and they will compare the lot numbers. The pharmacist says the patient should have received a letter. Patient called, left VM of the above.  Trazodone refill Last OV:11/03/17 Last refill:11/03/17 30 tab/2 refills ZOX:WRUEAVPCP:[facility] [address] - [place], [place] - [id number] [phone] (Phone) [phone] (Fax)

## 2018-04-25 ENCOUNTER — Other Ambulatory Visit: Payer: Self-pay | Admitting: Internal Medicine

## 2018-04-25 ENCOUNTER — Telehealth: Payer: Self-pay | Admitting: Internal Medicine

## 2018-04-25 DIAGNOSIS — I10 Essential (primary) hypertension: Secondary | ICD-10-CM

## 2018-04-25 MED ORDER — LOSARTAN POTASSIUM 25 MG PO TABS: 25.0000 mg | ORAL_TABLET | Freq: Every day | ORAL | 0 refills | Status: DC

## 2018-04-25 MED ORDER — TRAZODONE HCL 50 MG PO TABS: 50.0000 mg | ORAL_TABLET | Freq: Every evening | ORAL | 0 refills | Status: DC | PRN

## 2018-04-25 NOTE — Telephone Encounter (Signed)
Call pt needs appt with PCP further refills after these new Rx expire Last seen her blood pressure was not controlled  Also not all losartan types are recalled the pharmacy will know and should not have the recalled medication   TMS

## 2018-04-25 NOTE — Telephone Encounter (Signed)
lov 11/03/17 To follow up 1 month  No office visit scheduled

## 2018-04-27 NOTE — Telephone Encounter (Addendum)
Spoke with patient advised of below, she will check with pharmacy in regards to BP medication. Appointment scheduled for follow up.

## 2018-05-02 ENCOUNTER — Ambulatory Visit (INDEPENDENT_AMBULATORY_CARE_PROVIDER_SITE_OTHER): Payer: BLUE CROSS/BLUE SHIELD | Admitting: Family

## 2018-05-02 ENCOUNTER — Encounter: Payer: Self-pay | Admitting: Family

## 2018-05-02 VITALS — BP 134/76 | HR 69 | Temp 98.7°F | Wt 187.8 lb

## 2018-05-02 DIAGNOSIS — D519 Vitamin B12 deficiency anemia, unspecified: Secondary | ICD-10-CM

## 2018-05-02 DIAGNOSIS — R200 Anesthesia of skin: Secondary | ICD-10-CM

## 2018-05-02 DIAGNOSIS — R7303 Prediabetes: Secondary | ICD-10-CM

## 2018-05-02 DIAGNOSIS — K649 Unspecified hemorrhoids: Secondary | ICD-10-CM | POA: Insufficient documentation

## 2018-05-02 LAB — HEMOGLOBIN A1C: HEMOGLOBIN A1C: 6.2 % (ref 4.6–6.5)

## 2018-05-02 LAB — B12 AND FOLATE PANEL
FOLATE: 12.3 ng/mL (ref >5.9)
Vitamin B-12: 207 pg/mL — ABNORMAL LOW (ref 211–911)

## 2018-05-02 MED ORDER — HYDROCORTISONE 2.5 % RE CREA: 1.0000 "application " | TOPICAL_CREAM | Freq: Two times a day (BID) | RECTAL | 0 refills | Status: DC

## 2018-05-02 NOTE — Progress Notes (Signed)
Subjective:    Patient ID: Ashlee Bass, female    DOB: 04-21-1961, 57 y.o.   MRN: [id number]  CC: Ashlee Bass is a 57 y.o. female who presents today for follow up.   HPI: Feels well today. No new complaints.   Still awaiting on sleep study; insurance problems.   insomnia- Much improved on Trazodone  Numbness in 'the very tip' 5th, 4th, and 3rd  fingers- unchanged.Starts in hands. No pain in shoulder or neck.  Intermittent. Comes in spells.  Uses phone a lot, primarily thumbs to type.   H/o hemorrhoids for years, unchanged; sees BRB approx once per week 'only when wipes or if stool is hard.' straining. No blood in stool. No melena   Colored stool. Uses Preparation H with relief.   Unsure of when colonoscopy due.     TSH normal 10/2017 A1c prediabetic HISTORY:  Past Medical History:  Diagnosis Date  . Allergy   . Asthma   . GERD (gastroesophageal reflux disease)   . Hyperlipidemia   . Hypertension   . Kidney stones   . Urinary tract infection    History reviewed. No pertinent surgical history. Family History  Problem Relation Age of Onset  . Arthritis Mother   . Hyperlipidemia Mother   . Heart disease Mother        Heart Failure  . Hypertension Mother   . Diabetes Mother   . COPD Mother   . Arthritis Father   . Heart disease Father   . Hyperlipidemia Father   . Hypertension Father   . Kidney disease Father        ESRD - dialysis  . Arthritis Paternal Grandmother   . Breast cancer Paternal Grandmother     Allergies: Codeine; Penicillins; Telithromycin; and Azithromycin Current Outpatient Medications on File Prior to Visit  Medication Sig Dispense Refill  . atorvastatin (LIPITOR) 20 MG tablet Take 1 tablet (20 mg total) by mouth daily at 6 PM. 90 tablet 1  . Cholecalciferol 50000 units capsule Take 1 capsule (50,000 Units total) by mouth once a week. 13 capsule 1  . fluticasone (FLONASE) 50 MCG/ACT nasal spray Place 2 sprays into both nostrils  daily. 16 g 6  . fluticasone-salmeterol (ADVAIR HFA) 115-21 MCG/ACT inhaler Inhale 2 puffs into the lungs 2 (two) times daily. Rinse mouth 1 Inhaler 2  . losartan (COZAAR) 25 MG tablet Take 1 tablet (25 mg total) by mouth daily. 90 tablet 0  . metoprolol tartrate (LOPRESSOR) 100 MG tablet TAKE ONE TABLET BY MOUTH ONCE DAILY 30 tablet 0  . omeprazole (PRILOSEC) 20 MG capsule Take by mouth.    Marland Kitchen PROAIR HFA 108 (90 BASE) MCG/ACT inhaler Inhale 2 puffs into the lungs every 6 (six) hours as needed for wheezing or shortness of breath. 1 Inhaler 3  . traZODone (DESYREL) 50 MG tablet Take 1 tablet (50 mg total) by mouth at bedtime as needed for sleep. 90 tablet 0   No current facility-administered medications on file prior to visit.     Social History   Tobacco Use  . Smoking status: Never Smoker  . Smokeless tobacco: Never Used  Substance Use Topics  . Alcohol use: No    Alcohol/week: 0.0 oz  . Drug use: No    Review of Systems  Constitutional: Negative for chills and fever.  Respiratory: Negative for cough.   Cardiovascular: Negative for chest pain and palpitations.  Gastrointestinal: Positive for anal bleeding. Negative for abdominal pain, blood in stool, nausea  and vomiting.  Neurological: Positive for numbness. Negative for headaches.  Psychiatric/Behavioral: Negative for sleep disturbance (improved).      Objective:    BP 134/76 (BP Location: Left Arm, Patient Position: Sitting, Cuff Size: Large)   Pulse 69   Temp 98.7 F (37.1 C) (Oral)   Wt 187 lb 12 oz (85.2 kg)   SpO2 97%   BMI 35.48 kg/m  BP Readings from Last 3 Encounters:  05/02/18 134/76  11/03/17 (!) 150/88  08/16/16 (!) 160/80   Wt Readings from Last 3 Encounters:  05/02/18 187 lb 12 oz (85.2 kg)  11/03/17 196 lb 8 oz (89.1 kg)  08/16/16 200 lb (90.7 kg)    Physical Exam  Constitutional: She appears well-developed and well-nourished.  Eyes: Conjunctivae are normal.  Cardiovascular: Normal rate, regular  rhythm, normal heart sounds and normal pulses.  Pulmonary/Chest: Effort normal and breath sounds normal. She has no wheezes. She has no rhonchi. She has no rales.  Genitourinary: Rectal exam shows no external hemorrhoid and no internal hemorrhoid.  Genitourinary Comments: Skin appreciated approx 6 oclock proximal to anus. No obvious external hemorrhoid seen.   Musculoskeletal:       Right wrist: She exhibits normal range of motion, no tenderness and no bony tenderness.       Left wrist: She exhibits normal range of motion, no tenderness and no bony tenderness.       Right hand: She exhibits normal range of motion, no tenderness and no swelling. Normal sensation noted. Normal strength noted.       Left hand: She exhibits normal range of motion, no tenderness and no swelling. Normal sensation noted. Normal strength noted.  Neurological: She is alert.  Skin: Skin is warm and dry.  Psychiatric: She has a normal mood and affect. Her speech is normal and behavior is normal. Thought content normal.  Vitals reviewed.      Assessment & Plan:   Problem List Items Addressed This Visit      Cardiovascular and Mediastinum   Hemorrhoids - Primary    Description of intermittent BRB and straining support hemorrhoids. Patient will confirm when repeat colonoscopy due and bring back stool cards. Trial of anusol. She will let me know if persists and if would like consult with GI for lancing/banding of hemorrhoids.       Relevant Medications   hydrocortisone (ANUSOL-HC) 2.5 % rectal cream   Other Relevant Orders   Fecal occult blood, imunochemical     Other   Numbness    Unchanged. Declines orthopedic or neurology consult today. Suspect ulnar nerve entrapment based on location of numbness being 5th, 4th, and 3rd finger tips. Normal exam today. Pending labs. Patient will let me know if would like to evaluate further.       Relevant Orders   B12 and Folate Panel   Hemoglobin A1c       I am having  Ashlee Bass start on hydrocortisone. I am also having her maintain her fluticasone, PROAIR HFA, metoprolol tartrate, omeprazole, fluticasone-salmeterol, Cholecalciferol, atorvastatin, traZODone, and losartan.   Meds ordered this encounter  Medications  . hydrocortisone (ANUSOL-HC) 2.5 % rectal cream    Sig: Place 1 application rectally 2 (two) times daily.    Dispense:  30 g    Refill:  0    Order Specific Question:   Supervising Provider    Answer:   Sherlene Shams [[id number]]    Return precautions given.   Risks, benefits, and alternatives of the  medications and treatment plan prescribed today were discussed, and patient expressed understanding.   Education regarding symptom management and diagnosis given to patient on AVS.  Continue to follow with Allegra GranaArnett, Suzanne Garbers G, FNP for routine health maintenance.   Ashlee Mainobin Squires Verrette and I agreed with plan.   Rennie PlowmanMargaret Demisha Nokes, FNP

## 2018-05-02 NOTE — Assessment & Plan Note (Signed)
Description of intermittent BRB and straining support hemorrhoids. Patient will confirm when repeat colonoscopy due and bring back stool cards. Trial of anusol. She will let me know if persists and if would like consult with GI for lancing/banding of hemorrhoids.

## 2018-05-02 NOTE — Assessment & Plan Note (Signed)
Unchanged. Declines orthopedic or neurology consult today. Suspect ulnar nerve entrapment based on location of numbness being 5th, 4th, and 3rd finger tips. Normal exam today. Pending labs. Patient will let me know if would like to evaluate further.

## 2018-05-02 NOTE — Progress Notes (Signed)
From what I can understand from Ashlee Bass's note it said that the patient was to contact her doctor to order a HST, so it doesn't look like her insurance wouldn't pay for in lab but that is all I see. I can send the order for a hst, or you can refer her to [facility]. Let me know how you would like to proceed.

## 2018-05-02 NOTE — Patient Instructions (Addendum)
Please send me a mychart message if you do not hear from us in 7-10 days regarding sleep study.   [facility] you [facility]; let me know when you are due to repeat.   Please return stool cards  Let me know if you would like to see GI for hemorrhoid removal.   Let me know if you would like to consult neurology or orthopedics for numbness in finger tips.

## 2018-05-14 ENCOUNTER — Other Ambulatory Visit (INDEPENDENT_AMBULATORY_CARE_PROVIDER_SITE_OTHER): Payer: BLUE CROSS/BLUE SHIELD

## 2018-05-14 DIAGNOSIS — K649 Unspecified hemorrhoids: Secondary | ICD-10-CM

## 2018-05-14 LAB — FECAL OCCULT BLOOD, IMMUNOCHEMICAL: FECAL OCCULT BLD: NEGATIVE

## 2018-05-23 ENCOUNTER — Encounter: Payer: Self-pay | Admitting: Family

## 2018-05-24 ENCOUNTER — Encounter: Payer: Self-pay | Admitting: Family

## 2018-05-28 ENCOUNTER — Other Ambulatory Visit: Payer: Self-pay | Admitting: Family

## 2018-05-28 DIAGNOSIS — Z1231 Encounter for screening mammogram for malignant neoplasm of breast: Secondary | ICD-10-CM

## 2018-06-15 ENCOUNTER — Ambulatory Visit
Admission: RE | Admit: 2018-06-15 | Discharge: 2018-06-15 | Disposition: A | Discharge status: Home / Self Care | Payer: BLUE CROSS/BLUE SHIELD | Source: Ambulatory Visit | Attending: Family | Admitting: Family

## 2018-06-15 DIAGNOSIS — J019 Acute sinusitis, unspecified: Secondary | ICD-10-CM | POA: Diagnosis not present

## 2018-06-15 DIAGNOSIS — Z1231 Encounter for screening mammogram for malignant neoplasm of breast: Secondary | ICD-10-CM | POA: Insufficient documentation

## 2018-06-15 DIAGNOSIS — J069 Acute upper respiratory infection, unspecified: Secondary | ICD-10-CM | POA: Diagnosis not present

## 2018-06-15 DIAGNOSIS — R05 Cough: Secondary | ICD-10-CM | POA: Diagnosis not present

## 2018-06-28 ENCOUNTER — Ambulatory Visit: Payer: Self-pay | Admitting: Family

## 2018-06-28 NOTE — Telephone Encounter (Signed)
Pt called with return of  wheezing and tight cough after treatment 2 weeks ago. Pt states that breathing and wheezing gets worse with activity. States she has never been dx with lung disease. States MD has toold her she has seasonal asthma. Pt has completed a course of antibiotic with some initial relief of nasal stuffiness but cough remained. Pt scheduled  appointment tomorrow. Care advice given. Pt verbalized understanding of all instructions. Reason for Disposition . [1] MILD difficulty breathing (e.g., minimal/no SOB at rest, SOB with walking, pulse <100) AND [2] NEW-onset or WORSE than normal    Pt has been treated x1 and symptoms have come back Describes weezing and cough tight  Answer Assessment - Initial Assessment Questions 1. RESPIRATORY STATUS: "Describe your breathing?" (e.g., wheezing, shortness of breath, unable to speak, severe coughing)      SOB 2. ONSET: "When did this breathing problem begin?"      Possible 9/3 started for second time 3. PATTERN "Does the difficult breathing come and go, or has it been constant since it started?"      It comes with activity 4. SEVERITY: "How bad is your breathing?" (e.g., mild, moderate, severe)    - MILD: No SOB at rest, mild SOB with walking, speaks normally in sentences, can lay down, no retractions, pulse < 100.    - MODERATE: SOB at rest, SOB with minimal exertion and prefers to sit, cannot lie down flat, speaks in phrases, mild retractions, audible wheezing, pulse 100-120.    - SEVERE: Very SOB at rest, speaks in single words, struggling to breathe, sitting hunched forward, retractions, pulse > 120      Moderate 5. RECURRENT SYMPTOM: "Have you had difficulty breathing before?" If so, ask: "When was the last time?" and "What happened that time?"      Yes treated 8/30 6. CARDIAC HISTORY: "Do you have any history of heart disease?" (e.g., heart attack, angina, bypass surgery, angioplasty)      no 7. LUNG HISTORY: "Do you have any history of  lung disease?"  (e.g., pulmonary embolus, asthma, emphysema)     Seasonal asthma 8. 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 OTHER SYMPTOMS: "Do you have any other symptoms? (e.g., dizziness, runny nose, cough, chest pain, fever)     Cough No fever now  But started with low grade 100.1 10. PREGNANCY: "Is there any chance you are pregnant?" "When was your last menstrual period?"       no 11. TRAVEL: "Have you traveled out of the country in the last month?" (e.g., travel history, exposures)       no  Protocols used: BREATHING DIFFICULTY-A-AH

## 2018-06-29 ENCOUNTER — Encounter: Payer: Self-pay | Admitting: Family Medicine

## 2018-06-29 ENCOUNTER — Ambulatory Visit (INDEPENDENT_AMBULATORY_CARE_PROVIDER_SITE_OTHER): Payer: BLUE CROSS/BLUE SHIELD | Admitting: Family Medicine

## 2018-06-29 VITALS — BP 164/90 | HR 108 | Temp 98.3°F | Resp 17 | Ht 61.0 in | Wt 211.2 lb

## 2018-06-29 DIAGNOSIS — R062 Wheezing: Secondary | ICD-10-CM | POA: Diagnosis not present

## 2018-06-29 DIAGNOSIS — J302 Other seasonal allergic rhinitis: Secondary | ICD-10-CM | POA: Diagnosis not present

## 2018-06-29 DIAGNOSIS — J4530 Mild persistent asthma, uncomplicated: Secondary | ICD-10-CM | POA: Diagnosis not present

## 2018-06-29 MED ORDER — MONTELUKAST SODIUM 10 MG PO TABS: 10.0000 mg | ORAL_TABLET | Freq: Every day | ORAL | 1 refills | Status: DC

## 2018-06-29 MED ORDER — PREDNISONE 10 MG (21) PO TBPK: ORAL_TABLET | ORAL | 0 refills | Status: DC

## 2018-06-29 MED ORDER — FLUTICASONE FUROATE-VILANTEROL 200-25 MCG/INH IN AEPB: 1.0000 | INHALATION_SPRAY | Freq: Every day | RESPIRATORY_TRACT | 5 refills | Status: DC

## 2018-06-29 NOTE — Progress Notes (Signed)
Subjective:    Patient ID: Ashlee Bass, female    DOB: 10-12-1961, 57 y.o.    MRN: [id number]  HPI   Patient presents to clinic with cough, wheezing, short of breath off and on for the past month.  She went to [place] clinic 2 weeks ago was prescribed antibiotics and advised to use inhaler.  States she finished a course of doxycycline, felt slightly better for a few days, but symptoms on return.  Patient has a history of asthma, states usually has triggers this time of year with the change in season.  She said most recently she has been using her asthma inhaler 3-4 times a day every day.  Denies any fever or chills. Using mucinex at home to help cough.   Patient Active Problem List   Diagnosis Date Noted  . Hemorrhoids 05/02/2018  . OSA (obstructive sleep apnea) 11/07/2017  . Snoring 11/07/2017  . Cough 11/07/2017  . Abdominal pain 11/07/2017  . Numbness 11/07/2017  . Acute bronchitis 08/16/2016  . Chronic pain of left knee 08/16/2016  . Insomnia 08/16/2016  . Encounter to establish care 08/16/2016  . Asthma 08/03/2016  . Absolute anemia 09/20/2015  . Essential hypertension 09/20/2015  . Obese 12/31/2014  . Family history of colonic polyps 08/19/2014  . GERD (gastroesophageal reflux disease) 10/16/2013  . Seasonal allergies 10/16/2013   Social History   Tobacco Use  . Smoking status: Never Smoker  . Smokeless tobacco: Never Used  Substance Use Topics  . Alcohol use: No    Alcohol/week: 0.0 standard drinks   Review of Systems  Constitutional: Negative for chills, fatigue and fever.  HENT: +congestion, ear fullness   Eyes: Negative.   Respiratory:  +cough, shortness of breath and wheezing.   Cardiovascular: Negative for chest pain, palpitations and leg swelling.  Gastrointestinal: Negative for abdominal pain, diarrhea, nausea and vomiting.  Genitourinary: Negative for dysuria, frequency and urgency.  Musculoskeletal: Negative for arthralgias and myalgias.    Skin: Negative for color change, pallor and rash.  Neurological: Negative for syncope, light-headedness and headaches.  Psychiatric/Behavioral: The patient is not nervous/anxious.       Objective:   Physical Exam  Constitutional: She is oriented to person, place, and time. She appears well-developed and well-nourished. No distress.  Eyes: Right eye exhibits no discharge. Left eye exhibits no discharge. No scleral icterus.  Neck: Neck supple. No tracheal deviation present.  Cardiovascular: Regular rhythm.  Pulmonary/Chest: Effort normal. No respiratory distress. She has wheezes. She has no rales.  Expiratory wheeze upper lobes  Neurological: She is alert and oriented to person, place, and time.  Gait normal  Skin: Skin is warm and dry. No pallor.  Psychiatric: She has a normal mood and affect. Her behavior is normal.  Nursing note and vitals reviewed.  Vitals:   06/29/18 0815  BP: (!) 164/90  Pulse: (!) 108  Resp: 17  Temp: 98.3 F (36.8 C)  SpO2: 92%      Assessment & Plan:    Acute bronchitis --patient will take prednisone burst.  She will continue using albuterol inhaler as needed.  She will also continue to use Mucinex as she has been doing to help calm cough.  Mucinex also can help thin phlegm so it is easier to cough up.  Asthma - patient has been using albuterol inhaler very frequently.  We will add in once daily Breo to help better control asthma symptoms.  She will also begin taking once daily Singulair to calm bodies  allergen responses/triggers that seem to inflame asthma.  Patient will follow-up in approximately 4 weeks for recheck on how breathing is doing with addition of new medications.  Patient aware that if she cannot catch breath even after using albuterol inhaler, she is to call the office and or go to the emergency room right away.

## 2018-07-27 ENCOUNTER — Encounter: Payer: Self-pay | Admitting: Family Medicine

## 2018-07-27 ENCOUNTER — Ambulatory Visit (INDEPENDENT_AMBULATORY_CARE_PROVIDER_SITE_OTHER): Payer: BLUE CROSS/BLUE SHIELD | Admitting: Family Medicine

## 2018-07-27 VITALS — BP 144/86 | HR 71 | Temp 98.4°F | Ht 61.0 in | Wt 211.0 lb

## 2018-07-27 DIAGNOSIS — J4530 Mild persistent asthma, uncomplicated: Secondary | ICD-10-CM | POA: Diagnosis not present

## 2018-07-27 DIAGNOSIS — Z23 Encounter for immunization: Secondary | ICD-10-CM | POA: Diagnosis not present

## 2018-07-27 DIAGNOSIS — G4700 Insomnia, unspecified: Secondary | ICD-10-CM

## 2018-07-27 MED ORDER — PROAIR HFA 108 (90 BASE) MCG/ACT IN AERS: 2.0000 | INHALATION_SPRAY | Freq: Four times a day (QID) | RESPIRATORY_TRACT | 3 refills | Status: DC | PRN

## 2018-07-27 MED ORDER — TRAZODONE HCL 50 MG PO TABS: 50.0000 mg | ORAL_TABLET | Freq: Every evening | ORAL | 0 refills | Status: DC | PRN

## 2018-07-27 NOTE — Progress Notes (Addendum)
Subjective:    Patient ID: Ashlee Bass, female    DOB: [birth year], 57 y.o.   MRN: [id number]  HPI  Presents to clinic to follow up on breathing after adding in [place]. She had been using albuterol inhaler at least 2-3 times per day, so Breo was added for better asthma control. She states her breathing is now improved.  Coughing is better.  Is only needed to use pro-air albuterol inhaler 2-3 times in the last month with addition of Breo.  She also continues to take Singulair at bedtime to help control any asthma/allergy triggers.  Also needs refill on trazodone, uses med with success to treat insomnia.  Patient Active Problem List   Diagnosis Date Noted  . Hemorrhoids 05/02/2018  . OSA (obstructive sleep apnea) 11/07/2017  . Snoring 11/07/2017  . Cough 11/07/2017  . Abdominal pain 11/07/2017  . Numbness 11/07/2017  . Acute bronchitis 08/16/2016  . Chronic pain of left knee 08/16/2016  . Insomnia 08/16/2016  . Encounter to establish care 08/16/2016  . Asthma 08/03/2016  . Absolute anemia 09/20/2015  . Essential hypertension 09/20/2015  . Obese 12/31/2014  . Family history of colonic polyps 08/19/2014  . GERD (gastroesophageal reflux disease) 10/16/2013  . Seasonal allergies 10/16/2013   Social History   Tobacco Use  . Smoking status: Never Smoker  . Smokeless tobacco: Never Used  Substance Use Topics  . Alcohol use: No    Alcohol/week: 0.0 standard drinks    Review of Systems  Constitutional: Negative for chills, fatigue and fever.  HENT: Negative for congestion, ear pain, sinus pain and sore throat.   Eyes: Negative.   Respiratory: +cough, better with Breo. Negative for shortness of breath and wheezing.   Cardiovascular: Negative for chest pain, palpitations and leg swelling.  Gastrointestinal: Negative for abdominal pain, diarrhea, nausea and vomiting.  Genitourinary: Negative for dysuria, frequency and urgency.  Musculoskeletal: Negative for arthralgias and  myalgias.  Skin: Negative for color change, pallor and rash.  Neurological: Negative for syncope, light-headedness and headaches.  Psychiatric/Behavioral: The patient is not nervous/anxious.       Objective:   Physical Exam  Constitutional:  She appears well-developed and well-nourished. No distress.  Head: Normocephalic and atraumatic.  Eyes: EOM are normal. No scleral icterus.  Neck: Normal range of motion. Neck supple. No tracheal deviation present.  Cardiovascular: Normal rate, regular rhythm and normal heart sounds.  Pulmonary/Chest: Effort normal and breath sounds normal. No respiratory distress. She has no wheezes. She has no rales.  Neurological: She is alert and oriented to person, place, and time.  Gait normal  Skin: Skin is warm and dry. No pallor.  Psychiatric: She has a normal mood and affect. Her behavior is normal. Thought content normal.   Nursing note and vitals reviewed.     Vitals:   07/27/18 0807 07/27/18 0825  BP: (!) 152/98 (!) 144/86  Pulse: 71   Temp: 98.4 F (36.9 C)   SpO2: 96%     Assessment & Plan:    Mild Persistent asthma - patient will continue Breo.  She will also use Pro Air albuterol inhaler as needed for episodes of shortness of breath /wheezing.  She will also continue Singulair at bedtime to control allergy/asthma symptoms as well.  Patient advised that if breathing worsens, she begins using Pro Air inhaler more often to call and let us know, if that occurs she may need asthma medication adjustment.  Insomnia - patient's insomnia is well controlled with use of  trazodone.  Refill for this medication given.  Patient given flu vaccine in clinic today.  Patient will follow-up in 3 months for recheck on chronic conditions.  Advised to return to clinic sooner if issues arise.

## 2018-07-27 NOTE — Patient Instructions (Signed)
Great to see you!  If breathing worsens -- call us right away!

## 2018-07-30 ENCOUNTER — Other Ambulatory Visit: Payer: Self-pay | Admitting: Family

## 2018-07-30 DIAGNOSIS — I10 Essential (primary) hypertension: Secondary | ICD-10-CM

## 2018-08-06 DIAGNOSIS — G4733 Obstructive sleep apnea (adult) (pediatric): Secondary | ICD-10-CM | POA: Diagnosis not present

## 2018-08-15 ENCOUNTER — Telehealth: Payer: Self-pay | Admitting: Family

## 2018-08-15 NOTE — Telephone Encounter (Signed)
Copied from CRM [id number]. Topic: General - Other >> Aug 15, 2018  1:21 PM Laural Benes, [place] C wrote: Reason for CRM: pt called in to see if she has had a T-Dap shot? Pt says that she is about to have grand babies and have to have injection.   Please advise.   CB: [phone]

## 2018-08-15 NOTE — Telephone Encounter (Signed)
No record of Tdap in chart. Ok to schedule for nurse visit .

## 2018-08-16 ENCOUNTER — Ambulatory Visit: Payer: BLUE CROSS/BLUE SHIELD

## 2018-08-16 NOTE — Telephone Encounter (Signed)
That is fine . Thanks for looking into chart

## 2018-08-16 NOTE — Telephone Encounter (Signed)
Appointment scheduled 11/06/17

## 2018-08-27 ENCOUNTER — Ambulatory Visit (INDEPENDENT_AMBULATORY_CARE_PROVIDER_SITE_OTHER): Payer: BLUE CROSS/BLUE SHIELD

## 2018-08-27 DIAGNOSIS — Z23 Encounter for immunization: Secondary | ICD-10-CM

## 2018-08-27 NOTE — Progress Notes (Addendum)
Patient presented for TDAP injection to left deltoid, patient voiced no concerns nor showed any signs of distress during injection. Agree with plan. Rennie Plowman, NP

## 2018-09-06 DIAGNOSIS — G4733 Obstructive sleep apnea (adult) (pediatric): Secondary | ICD-10-CM | POA: Diagnosis not present

## 2018-10-06 DIAGNOSIS — G4733 Obstructive sleep apnea (adult) (pediatric): Secondary | ICD-10-CM | POA: Diagnosis not present

## 2018-10-10 ENCOUNTER — Other Ambulatory Visit: Payer: Self-pay | Admitting: Family

## 2018-10-10 ENCOUNTER — Other Ambulatory Visit: Payer: Self-pay | Admitting: Family Medicine

## 2018-10-10 DIAGNOSIS — G4700 Insomnia, unspecified: Secondary | ICD-10-CM

## 2018-10-10 DIAGNOSIS — I10 Essential (primary) hypertension: Secondary | ICD-10-CM

## 2018-10-29 ENCOUNTER — Ambulatory Visit: Payer: BLUE CROSS/BLUE SHIELD | Admitting: Family Medicine

## 2018-11-06 DIAGNOSIS — G4733 Obstructive sleep apnea (adult) (pediatric): Secondary | ICD-10-CM | POA: Diagnosis not present

## 2018-12-07 DIAGNOSIS — G4733 Obstructive sleep apnea (adult) (pediatric): Secondary | ICD-10-CM | POA: Diagnosis not present

## 2018-12-11 ENCOUNTER — Other Ambulatory Visit: Payer: Self-pay | Admitting: Family

## 2018-12-11 DIAGNOSIS — I10 Essential (primary) hypertension: Secondary | ICD-10-CM

## 2018-12-31 ENCOUNTER — Telehealth: Payer: Self-pay | Admitting: Lab

## 2018-12-31 NOTE — Telephone Encounter (Signed)
error 

## 2019-01-05 DIAGNOSIS — G4733 Obstructive sleep apnea (adult) (pediatric): Secondary | ICD-10-CM | POA: Diagnosis not present

## 2019-01-08 ENCOUNTER — Other Ambulatory Visit: Payer: Self-pay | Admitting: Family

## 2019-01-08 DIAGNOSIS — G4700 Insomnia, unspecified: Secondary | ICD-10-CM

## 2019-02-06 ENCOUNTER — Encounter: Payer: Self-pay | Admitting: Family

## 2019-02-19 ENCOUNTER — Other Ambulatory Visit: Payer: Self-pay | Admitting: Family Medicine

## 2019-02-19 DIAGNOSIS — J4530 Mild persistent asthma, uncomplicated: Secondary | ICD-10-CM

## 2019-02-19 DIAGNOSIS — R062 Wheezing: Secondary | ICD-10-CM

## 2019-02-19 DIAGNOSIS — J302 Other seasonal allergic rhinitis: Secondary | ICD-10-CM

## 2019-06-06 ENCOUNTER — Encounter: Payer: Self-pay | Admitting: Family

## 2019-06-07 ENCOUNTER — Other Ambulatory Visit: Payer: Self-pay | Admitting: Family

## 2019-06-07 DIAGNOSIS — R062 Wheezing: Secondary | ICD-10-CM

## 2019-06-07 DIAGNOSIS — J4530 Mild persistent asthma, uncomplicated: Secondary | ICD-10-CM

## 2019-06-07 DIAGNOSIS — J302 Other seasonal allergic rhinitis: Secondary | ICD-10-CM

## 2019-06-07 MED ORDER — MONTELUKAST SODIUM 10 MG PO TABS: 10.0000 mg | ORAL_TABLET | Freq: Every day | ORAL | 1 refills | Status: DC

## 2019-06-14 ENCOUNTER — Other Ambulatory Visit: Payer: Self-pay | Admitting: Family

## 2019-06-14 DIAGNOSIS — Z1231 Encounter for screening mammogram for malignant neoplasm of breast: Secondary | ICD-10-CM

## 2019-08-16 ENCOUNTER — Other Ambulatory Visit: Payer: Self-pay

## 2019-08-16 ENCOUNTER — Ambulatory Visit
Admission: RE | Admit: 2019-08-16 | Discharge: 2019-08-16 | Disposition: A | Discharge status: Home / Self Care | Payer: 59 | Source: Ambulatory Visit | Attending: Family | Admitting: Family

## 2019-08-16 DIAGNOSIS — Z1231 Encounter for screening mammogram for malignant neoplasm of breast: Secondary | ICD-10-CM | POA: Insufficient documentation

## 2019-09-07 ENCOUNTER — Other Ambulatory Visit: Payer: Self-pay | Admitting: Family

## 2019-09-07 ENCOUNTER — Encounter: Payer: Self-pay | Admitting: Family

## 2019-09-07 DIAGNOSIS — G4700 Insomnia, unspecified: Secondary | ICD-10-CM

## 2019-09-09 ENCOUNTER — Other Ambulatory Visit: Payer: Self-pay

## 2019-09-09 DIAGNOSIS — I10 Essential (primary) hypertension: Secondary | ICD-10-CM

## 2019-09-09 MED ORDER — METOPROLOL TARTRATE 100 MG PO TABS: 100.0000 mg | ORAL_TABLET | Freq: Every day | ORAL | 1 refills | Status: DC

## 2019-09-09 MED ORDER — LOSARTAN POTASSIUM 25 MG PO TABS: 25.0000 mg | ORAL_TABLET | Freq: Every day | ORAL | 1 refills | Status: DC

## 2019-09-09 MED ORDER — TRAZODONE HCL 50 MG PO TABS: 50.0000 mg | ORAL_TABLET | Freq: Every evening | ORAL | 0 refills | Status: DC | PRN

## 2019-09-23 DIAGNOSIS — Z20828 Contact with and (suspected) exposure to other viral communicable diseases: Secondary | ICD-10-CM | POA: Diagnosis not present

## 2019-10-02 DIAGNOSIS — Z20828 Contact with and (suspected) exposure to other viral communicable diseases: Secondary | ICD-10-CM | POA: Diagnosis not present

## 2019-10-23 ENCOUNTER — Other Ambulatory Visit: Payer: Self-pay | Admitting: Family

## 2019-10-23 DIAGNOSIS — G4700 Insomnia, unspecified: Secondary | ICD-10-CM

## 2019-10-31 ENCOUNTER — Other Ambulatory Visit: Payer: Self-pay

## 2019-10-31 DIAGNOSIS — J302 Other seasonal allergic rhinitis: Secondary | ICD-10-CM

## 2019-10-31 DIAGNOSIS — J4530 Mild persistent asthma, uncomplicated: Secondary | ICD-10-CM

## 2019-10-31 DIAGNOSIS — R062 Wheezing: Secondary | ICD-10-CM

## 2019-10-31 MED ORDER — MONTELUKAST SODIUM 10 MG PO TABS: 10.0000 mg | ORAL_TABLET | Freq: Every day | ORAL | 1 refills | Status: DC

## 2019-11-29 ENCOUNTER — Encounter: Payer: Self-pay | Admitting: Family

## 2019-11-29 ENCOUNTER — Other Ambulatory Visit: Payer: Self-pay

## 2019-11-29 ENCOUNTER — Ambulatory Visit (INDEPENDENT_AMBULATORY_CARE_PROVIDER_SITE_OTHER): Payer: BC Managed Care – PPO | Admitting: Family

## 2019-11-29 VITALS — BP 138/70 | HR 67 | Temp 97.5°F | Ht 61.5 in | Wt 199.2 lb

## 2019-11-29 DIAGNOSIS — I10 Essential (primary) hypertension: Secondary | ICD-10-CM

## 2019-11-29 DIAGNOSIS — Z0000 Encounter for general adult medical examination without abnormal findings: Secondary | ICD-10-CM

## 2019-11-29 DIAGNOSIS — K219 Gastro-esophageal reflux disease without esophagitis: Secondary | ICD-10-CM | POA: Diagnosis not present

## 2019-11-29 DIAGNOSIS — J4520 Mild intermittent asthma, uncomplicated: Secondary | ICD-10-CM

## 2019-11-29 DIAGNOSIS — G4700 Insomnia, unspecified: Secondary | ICD-10-CM

## 2019-11-29 DIAGNOSIS — G4733 Obstructive sleep apnea (adult) (pediatric): Secondary | ICD-10-CM

## 2019-11-29 LAB — CBC WITH DIFFERENTIAL/PLATELET
Basophils Absolute: 0.1 10*3/uL (ref 0.0–0.1)
Basophils Relative: 1.5 % (ref 0.0–3.0)
Eosinophils Absolute: 0.3 10*3/uL (ref 0.0–0.7)
Eosinophils Relative: 4.8 % (ref 0.0–5.0)
HCT: 42.1 % (ref 36.0–46.0)
Hemoglobin: 13.7 g/dL (ref 12.0–15.0)
Lymphocytes Relative: 29.1 % (ref 12.0–46.0)
Lymphs Abs: 1.7 10*3/uL (ref 0.7–4.0)
MCHC: 32.6 g/dL (ref 30.0–36.0)
MCV: 86.2 fl (ref 78.0–100.0)
Monocytes Absolute: 0.5 10*3/uL (ref 0.1–1.0)
Monocytes Relative: 8.3 % (ref 3.0–12.0)
Neutro Abs: 3.2 10*3/uL (ref 1.4–7.7)
Neutrophils Relative %: 56.3 % (ref 43.0–77.0)
Platelets: 281 10*3/uL (ref 150.0–400.0)
RBC: 4.89 Mil/uL (ref 3.87–5.11)
RDW: 15.1 % (ref 11.5–15.5)
WBC: 5.7 10*3/uL (ref 4.0–10.5)

## 2019-11-29 LAB — VITAMIN D 25 HYDROXY (VIT D DEFICIENCY, FRACTURES): VITD: 21.45 ng/mL — ABNORMAL LOW (ref 30.00–100.00)

## 2019-11-29 LAB — COMPREHENSIVE METABOLIC PANEL
ALT: 12 U/L (ref 0–35)
AST: 14 U/L (ref 0–37)
Albumin: 4 g/dL (ref 3.5–5.2)
Alkaline Phosphatase: 84 U/L (ref 39–117)
BUN: 17 mg/dL (ref 6–23)
CO2: 25 mEq/L (ref 19–32)
Calcium: 10.3 mg/dL (ref 8.4–10.5)
Chloride: 107 mEq/L (ref 96–112)
Creatinine, Ser: 0.82 mg/dL (ref 0.40–1.20)
GFR: 71.76 mL/min (ref >60.00)
Glucose, Bld: 108 mg/dL — ABNORMAL HIGH (ref 70–99)
Potassium: 4.1 mEq/L (ref 3.5–5.1)
Sodium: 139 mEq/L (ref 135–145)
Total Bilirubin: 0.4 mg/dL (ref 0.2–1.2)
Total Protein: 7 g/dL (ref 6.0–8.3)

## 2019-11-29 LAB — HEMOGLOBIN A1C: Hgb A1c MFr Bld: 6.1 % (ref 4.6–6.5)

## 2019-11-29 LAB — LIPID PANEL
Cholesterol: 219 mg/dL — ABNORMAL HIGH (ref 0–200)
HDL: 40 mg/dL (ref >39.00)
LDL Cholesterol: 152 mg/dL — ABNORMAL HIGH (ref 0–99)
NonHDL: 179.18
Total CHOL/HDL Ratio: 5
Triglycerides: 138 mg/dL (ref 0.0–149.0)
VLDL: 27.6 mg/dL (ref 0.0–40.0)

## 2019-11-29 LAB — TSH: TSH: 2 u[IU]/mL (ref 0.35–4.50)

## 2019-11-29 MED ORDER — METOPROLOL TARTRATE 50 MG PO TABS: 50.0000 mg | ORAL_TABLET | Freq: Two times a day (BID) | ORAL | 3 refills | Status: DC

## 2019-11-29 MED ORDER — TRAZODONE HCL 50 MG PO TABS: 75.0000 mg | ORAL_TABLET | Freq: Every evening | ORAL | 0 refills | Status: DC | PRN

## 2019-11-29 MED ORDER — AMLODIPINE BESYLATE 2.5 MG PO TABS: 2.5000 mg | ORAL_TABLET | Freq: Every day | ORAL | 3 refills | Status: DC

## 2019-11-29 MED ORDER — FAMOTIDINE 20 MG PO TABS: 20.0000 mg | ORAL_TABLET | Freq: Two times a day (BID) | ORAL | 1 refills | Status: DC

## 2019-11-29 NOTE — Assessment & Plan Note (Addendum)
Clinical breast exam performed today.  Deferred pelvic exam in the absence of complaints, Pap smear up-to-date.  Long discussion regards to not having colonoscopy or EGD report.  Patient had told us in the past it was done in 2015.  I have asked her to call back to her gastroenterologist and get these reports sent to [place] as well as confirm that she is on a 10-year plan.  Advised to start a walking program

## 2019-11-29 NOTE — Assessment & Plan Note (Signed)
Some breakthrough symptoms, increase trazodone 75 mg nightly.  Close follow up

## 2019-11-29 NOTE — Assessment & Plan Note (Signed)
Change lopressor to 50mg  BID, add amlodipine

## 2019-11-29 NOTE — Assessment & Plan Note (Addendum)
?   Throat tightness from GERD or esophageal stricture. Occurs with cold liquids.  Trial pepcid ac. She declines GI referral today for EGD. Awaiting records that patient will get from prior EGD/ colonoscopy. Close follow up.

## 2019-11-29 NOTE — Progress Notes (Signed)
Subjective:    Patient ID: Ashlee Bass, female    DOB: Feb 25, 1961, 59 y.o.   MRN: [id number]  CC: Ashlee Bass is a 59 y.o. female who presents today for physical exam.    HPI: She complains that she feels a "tightness' in her throat after caffeine.  She thinks it might just be after cold beverages.  It is sometimes associated with nausea; this has been going on for several months.  She denies any pain with swallowing, choking, regurgitation, vomiting, constipation, cough. She states she had an EGD and colonoscopy in 2015.  She believes that she was told to come back in 10 years although she is not sure   she does have a chronic postnasal drip with allergies, she feels like this is stable on current  regimen.  No wheezing or shortness of breath.  Asthma is at baseline. OSA-she is compliant with her CPAP.  Insomnia- she is sleeping well , some nights however she does wake up at 4 AM.   She is no depression.  No thoughts of hurting herself or anyone else.  No formal exercise at this time.    She does follow with a dermatologist and plans to make a follow-up appointment.    HTN-does not recall blood pressure at home.  Denies any chest pain.  She is currently taking Lopressor 100 mg once a day.       Colorectal Cancer Screening: UTD; no records of this.  Breast Cancer Screening: Mammogram UTD Cervical Cancer Screening: UTD Bone Health screening/DEXA for 65+: No increased fracture risk. Defer screening at this time. Lung Cancer Screening: Doesn't have 30 year pack year history and age > 55 years. Immunizations       Tetanus - UTD         Hepatitis C screening - Candidate for, declines HIV Screening- Candidate for, declines Labs: Screening labs today. Exercise: Gets regular exercise.  Alcohol use: non Smoking/tobacco use: Nonsmoker.    HISTORY:  Past Medical History:  Diagnosis Date  . Allergy   . Asthma   . GERD (gastroesophageal reflux disease)   .  Hyperlipidemia   . Hypertension   . Kidney stones   . Urinary tract infection     History reviewed. No pertinent surgical history. Family History  Problem Relation Age of Onset  . Arthritis Mother   . Hyperlipidemia Mother   . Heart disease Mother        Heart Failure  . Hypertension Mother   . Diabetes Mother   . COPD Mother   . Arthritis Father   . Heart disease Father   . Hyperlipidemia Father   . Hypertension Father   . Kidney disease Father        ESRD - dialysis  . Arthritis Paternal Grandmother   . Breast cancer Paternal Grandmother       ALLERGIES: Codeine, Penicillins, Telithromycin, and Azithromycin  Current Outpatient Medications on File Prior to Visit  Medication Sig Dispense Refill  . atorvastatin (LIPITOR) 20 MG tablet Take 1 tablet (20 mg total) by mouth daily at 6 PM. 90 tablet 1  . montelukast (SINGULAIR) 10 MG tablet Take 1 tablet (10 mg total) by mouth at bedtime. 90 tablet 1   No current facility-administered medications on file prior to visit.    Social History   Tobacco Use  . Smoking status: Never Smoker  . Smokeless tobacco: Never Used  Substance Use Topics  . Alcohol use: No    Alcohol/week:  0.0 standard drinks  . Drug use: No    Review of Systems  Constitutional: Negative for chills, fever and unexpected weight change.  HENT: Positive for postnasal drip. Negative for congestion.   Respiratory: Negative for cough, choking and shortness of breath.   Cardiovascular: Negative for chest pain, palpitations and leg swelling.  Gastrointestinal: Negative for nausea and vomiting.  Genitourinary: Negative for pelvic pain and vaginal discharge.  Musculoskeletal: Negative for arthralgias and myalgias.  Skin: Negative for rash.  Neurological: Negative for headaches.  Hematological: Negative for adenopathy.  Psychiatric/Behavioral: Negative for confusion.      Objective:    BP 138/70   Pulse 67   Temp (!) 97.5 F (36.4 C)   Ht 5' 1.5"  (1.562 m)   Wt 199 lb 3.2 oz (90.4 kg)   SpO2 97%   BMI 37.03 kg/m   BP Readings from Last 3 Encounters:  11/29/19 138/70  07/27/18 (!) 144/86  06/29/18 (!) 164/90   Wt Readings from Last 3 Encounters:  11/29/19 199 lb 3.2 oz (90.4 kg)  07/27/18 211 lb (95.7 kg)  06/29/18 211 lb 3.2 oz (95.8 kg)    Physical Exam Vitals reviewed.  Constitutional:      Appearance: She is well-developed.  Eyes:     Conjunctiva/sclera: Conjunctivae normal.  Neck:     Thyroid: No thyroid mass or thyromegaly.  Cardiovascular:     Rate and Rhythm: Normal rate and regular rhythm.     Pulses: Normal pulses.     Heart sounds: Normal heart sounds.  Pulmonary:     Effort: Pulmonary effort is normal.     Breath sounds: Normal breath sounds. No wheezing, rhonchi or rales.  Chest:     Breasts: Breasts are symmetrical.        Right: No inverted nipple, mass, nipple discharge, skin change or tenderness.        Left: No inverted nipple, mass, nipple discharge, skin change or tenderness.  Lymphadenopathy:     Head:     Right side of head: No submental, submandibular, tonsillar, preauricular, posterior auricular or occipital adenopathy.     Left side of head: No submental, submandibular, tonsillar, preauricular, posterior auricular or occipital adenopathy.     Cervical: No cervical adenopathy.     Right cervical: No superficial, deep or posterior cervical adenopathy.    Left cervical: No superficial, deep or posterior cervical adenopathy.  Skin:    General: Skin is warm and dry.  Neurological:     Mental Status: She is alert.  Psychiatric:        Speech: Speech normal.        Behavior: Behavior normal.        Thought Content: Thought content normal.        Assessment & Plan:   Problem List Items Addressed This Visit      Cardiovascular and Mediastinum   Essential hypertension    Change lopressor to 50mg  BID, add amlodipine       Relevant Medications   metoprolol tartrate (LOPRESSOR) 50  MG tablet   amLODipine (NORVASC) 2.5 MG tablet     Respiratory   Asthma    Asymptomatic. Doing well on regimen, will continue      OSA (obstructive sleep apnea)    Compliant with cipap.         Digestive   GERD (gastroesophageal reflux disease)    ? Throat tightnes GERD or esophageal stricture. Occurs with cold liquids.  Trial pepcid ac. She declines  GI referral today      Relevant Medications   famotidine (PEPCID) 20 MG tablet     Other   Annual physical exam - Primary    Clinical breast exam performed today.  Deferred pelvic exam in the absence of complaints, Pap smear up-to-date.  Long discussion regards to not having colonoscopy or EGD report.  Patient had told us in the past it was done in 2015.  I have asked her to call back to her gastroenterologist and get these reports sent to [place] as well as confirm that she is on a 10-year plan.  Advised to start a walking program      Relevant Orders   TSH   CBC with Differential/Platelet   Comprehensive metabolic panel   Hemoglobin A1c   Lipid panel   VITAMIN D 25 Hydroxy (Vit-D Deficiency, Fractures)   Insomnia   Relevant Medications   traZODone (DESYREL) 50 MG tablet       I have discontinued Erdine S. Hutt's fluticasone, metoprolol tartrate, omeprazole, fluticasone-salmeterol, Cholecalciferol, hydrocortisone, azelastine, benzonatate, chlorpheniramine-HYDROcodone, predniSONE, fluticasone furoate-vilanterol, ProAir HFA, metoprolol tartrate, and losartan. I have also changed her traZODone. Additionally, I am having her start on metoprolol tartrate, amLODipine, and famotidine. Lastly, I am having her maintain her atorvastatin and montelukast.   Meds ordered this encounter  Medications  . metoprolol tartrate (LOPRESSOR) 50 MG tablet    Sig: Take 1 tablet (50 mg total) by mouth 2 (two) times daily.    Dispense:  180 tablet    Refill:  3    Order Specific Question:   Supervising Provider    Answer:   Duncan Dull L [[id number]]    . amLODipine (NORVASC) 2.5 MG tablet    Sig: Take 1 tablet (2.5 mg total) by mouth daily.    Dispense:  90 tablet    Refill:  3    Order Specific Question:   Supervising Provider    Answer:   Duncan Dull L [[id number]]  . traZODone (DESYREL) 50 MG tablet    Sig: Take 1.5 tablets (75 mg total) by mouth at bedtime as needed. for sleep    Dispense:  90 tablet    Refill:  0    Order Specific Question:   Supervising Provider    Answer:   Duncan Dull L [[id number]]  . famotidine (PEPCID) 20 MG tablet    Sig: Take 1 tablet (20 mg total) by mouth 2 (two) times daily.    Dispense:  120 tablet    Refill:  1    Order Specific Question:   Supervising Provider    Answer:   Sherlene Shams [[id number]]    Return precautions given.   Risks, benefits, and alternatives of the medications and treatment plan prescribed today were discussed, and patient expressed understanding.   Education regarding symptom management and diagnosis given to patient on AVS.   Continue to follow with Allegra Grana, FNP for routine health maintenance.   Annie Main and I agreed with plan.   Rennie Plowman, FNP

## 2019-11-29 NOTE — Patient Instructions (Signed)
Increase trazodone to 75mg  at night  Start taking Lopressor 50 mg twice daily.  I think this may be more effective for blood pressure control. You may also start amlodipine 2.5 mg taken once a day. Monitor blood pressure,  Goal is less than 120/80, based on newest guidelines; if persistently higher, please make sooner follow up appointment so we can recheck you blood pressure and manage medications  Unfortunately, I do not have records of your EGD or colonoscopy.  I can only see that you told us that was done in 2015.  Please call your [facility] office and confirm when you were told to return for EGD or colonoscopy.  You may also have records of both of these sent to our office   Health Maintenance for Postmenopausal Women Menopause is a normal process in which your ability to get pregnant comes to an end. This process happens slowly over many months or years, usually between the ages of 18 and 4. Menopause is complete when you have missed your menstrual periods for 12 months. It is important to talk with your health care provider about some of the most common conditions that affect women after menopause (postmenopausal women). These include heart disease, cancer, and bone loss (osteoporosis). Adopting a healthy lifestyle and getting preventive care can help to promote your health and wellness. The actions you take can also lower your chances of developing some of these common conditions. What should I know about menopause? During menopause, you may get a number of symptoms, such as:  Hot flashes. These can be moderate or severe.  Night sweats.  Decrease in sex drive.  Mood swings.  Headaches.  Tiredness.  Irritability.  Memory problems.  Insomnia. Choosing to treat or not to treat these symptoms is a decision that you make with your health care provider. Do I need hormone replacement therapy?  Hormone replacement therapy is effective in treating symptoms that are caused by menopause,  such as hot flashes and night sweats.  Hormone replacement carries certain risks, especially as you become older. If you are thinking about using estrogen or estrogen with progestin, discuss the benefits and risks with your health care provider. What is my risk for heart disease and stroke? The risk of heart disease, heart attack, and stroke increases as you age. One of the causes may be a change in the body's hormones during menopause. This can affect how your body uses dietary fats, triglycerides, and cholesterol. Heart attack and stroke are medical emergencies. There are many things that you can do to help prevent heart disease and stroke. Watch your blood pressure  High blood pressure causes heart disease and increases the risk of stroke. This is more likely to develop in people who have high blood pressure readings, are of African descent, or are overweight.  Have your blood pressure checked: ? Every 3-5 years if you are 38-89 years of age. ? Every year if you are 25 years old or older. Eat a healthy diet   Eat a diet that includes plenty of vegetables, fruits, low-fat dairy products, and lean protein.  Do not eat a lot of foods that are high in solid fats, added sugars, or sodium. Get regular exercise Get regular exercise. This is one of the most important things you can do for your health. Most adults should:  Try to exercise for at least 150 minutes each week. The exercise should increase your heart rate and make you sweat (moderate-intensity exercise).  Try to do strengthening  exercises at least twice each week. Do these in addition to the moderate-intensity exercise.  Spend less time sitting. Even light physical activity can be beneficial. Other tips  Work with your health care provider to achieve or maintain a healthy weight.  Do not use any products that contain nicotine or tobacco, such as cigarettes, e-cigarettes, and chewing tobacco. If you need help quitting, ask your  health care provider.  Know your numbers. Ask your health care provider to check your cholesterol and your blood sugar (glucose). Continue to have your blood tested as directed by your health care provider. Do I need screening for cancer? Depending on your health history and family history, you may need to have cancer screening at different stages of your life. This may include screening for:  Breast cancer.  Cervical cancer.  Lung cancer.  Colorectal cancer. What is my risk for osteoporosis? After menopause, you may be at increased risk for osteoporosis. Osteoporosis is a condition in which bone destruction happens more quickly than new bone creation. To help prevent osteoporosis or the bone fractures that can happen because of osteoporosis, you may take the following actions:  If you are 34-48 years old, get at least 1,000 mg of calcium and at least 600 mg of vitamin D per day.  If you are older than age 58 but younger than age 22, get at least 1,200 mg of calcium and at least 600 mg of vitamin D per day.  If you are older than age 72, get at least 1,200 mg of calcium and at least 800 mg of vitamin D per day. Smoking and drinking excessive alcohol increase the risk of osteoporosis. Eat foods that are rich in calcium and vitamin D, and do weight-bearing exercises several times each week as directed by your health care provider. How does menopause affect my mental health? Depression may occur at any age, but it is more common as you become older. Common symptoms of depression include:  Low or sad mood.  Changes in sleep patterns.  Changes in appetite or eating patterns.  Feeling an overall lack of motivation or enjoyment of activities that you previously enjoyed.  Frequent crying spells. Talk with your health care provider if you think that you are experiencing depression. General instructions See your health care provider for regular wellness exams and vaccines. This may include:   Scheduling regular health, dental, and eye exams.  Getting and maintaining your vaccines. These include: ? Influenza vaccine. Get this vaccine each year before the flu season begins. ? Pneumonia vaccine. ? Shingles vaccine. ? Tetanus, diphtheria, and pertussis (Tdap) booster vaccine. Your health care provider may also recommend other immunizations. Tell your health care provider if you have ever been abused or do not feel safe at home. Summary  Menopause is a normal process in which your ability to get pregnant comes to an end.  This condition causes hot flashes, night sweats, decreased interest in sex, mood swings, headaches, or lack of sleep.  Treatment for this condition may include hormone replacement therapy.  Take actions to keep yourself healthy, including exercising regularly, eating a healthy diet, watching your weight, and checking your blood pressure and blood sugar levels.  Get screened for cancer and depression. Make sure that you are up to date with all your vaccines. This information is not intended to replace advice given to you by your health care provider. Make sure you discuss any questions you have with your health care provider. Document Revised: 10/03/2018 Document  Reviewed: 10/03/2018 Elsevier Patient Education  [employer].

## 2019-11-29 NOTE — Assessment & Plan Note (Signed)
Compliant with cipap.  

## 2019-11-29 NOTE — Assessment & Plan Note (Signed)
Asymptomatic. Doing well on regimen, will continue

## 2019-12-03 ENCOUNTER — Other Ambulatory Visit: Payer: Self-pay | Admitting: Family

## 2019-12-03 DIAGNOSIS — E785 Hyperlipidemia, unspecified: Secondary | ICD-10-CM

## 2019-12-06 ENCOUNTER — Other Ambulatory Visit: Payer: Self-pay

## 2019-12-06 DIAGNOSIS — E785 Hyperlipidemia, unspecified: Secondary | ICD-10-CM

## 2019-12-06 MED ORDER — ATORVASTATIN CALCIUM 20 MG PO TABS: 20.0000 mg | ORAL_TABLET | Freq: Every day | ORAL | 1 refills | Status: DC

## 2020-01-07 IMAGING — MG MM DIGITAL SCREENING BILAT W/ TOMO W/ CAD
8 series · 8 of 24 positions shown · non-contrast
Comparison: Previous exam(s).

CLINICAL DATA: Screening.

EXAM:
DIGITAL SCREENING BILATERAL MAMMOGRAM WITH TOMO AND CAD

[L CC synth-2D]
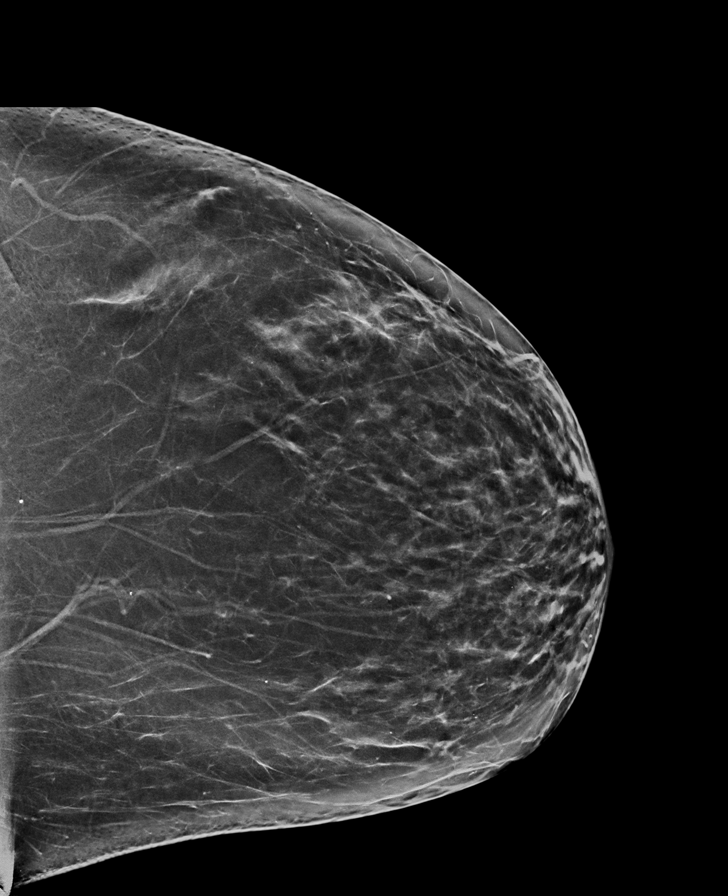

[R MLO synth-2D]
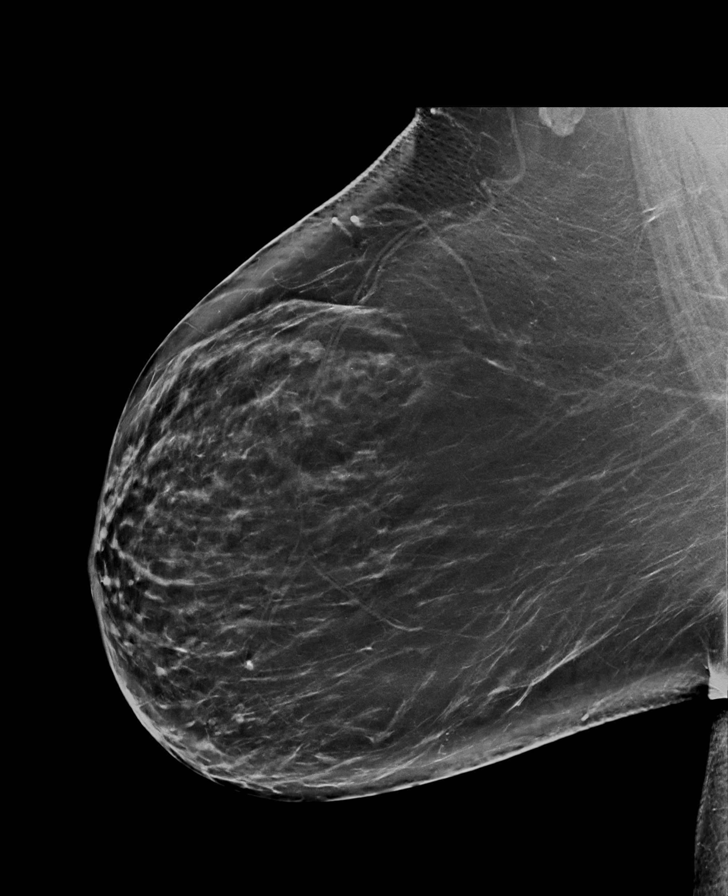

[L MLO synth-2D]
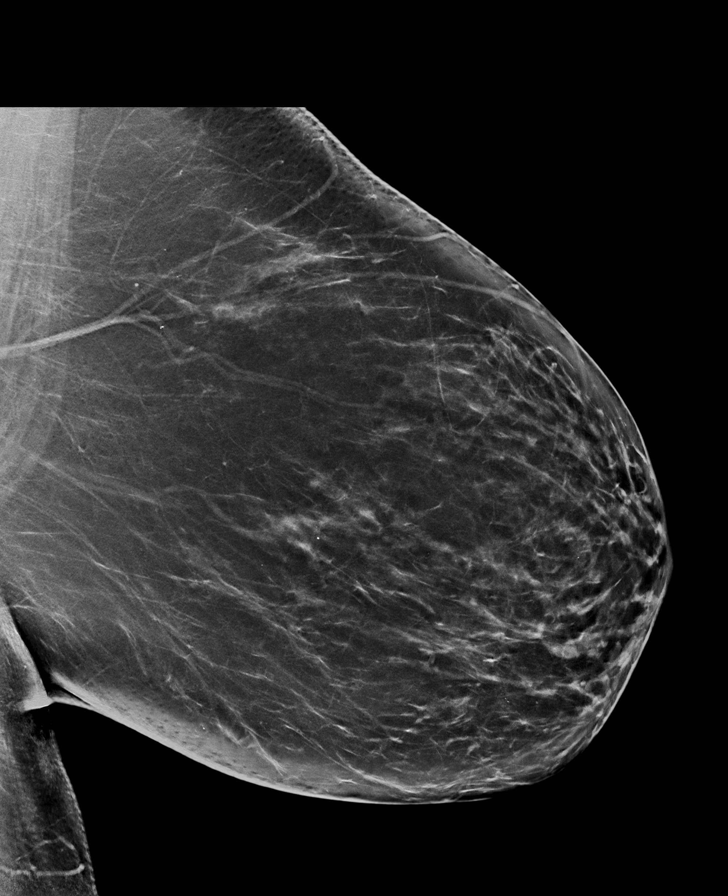

[R CC synth-2D]
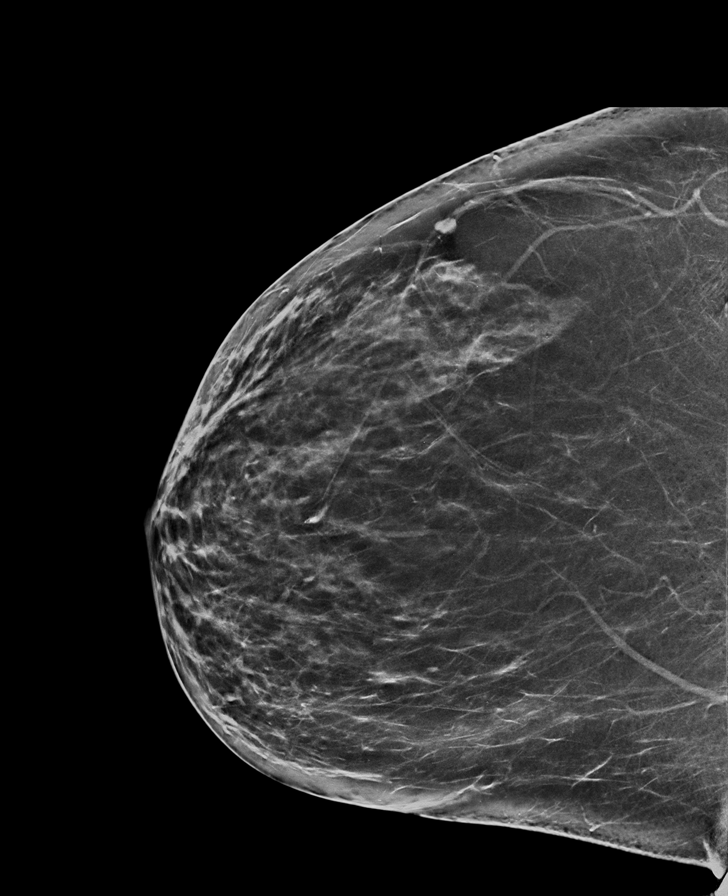

[L MLO tomo · tomo slice 45/90.0]
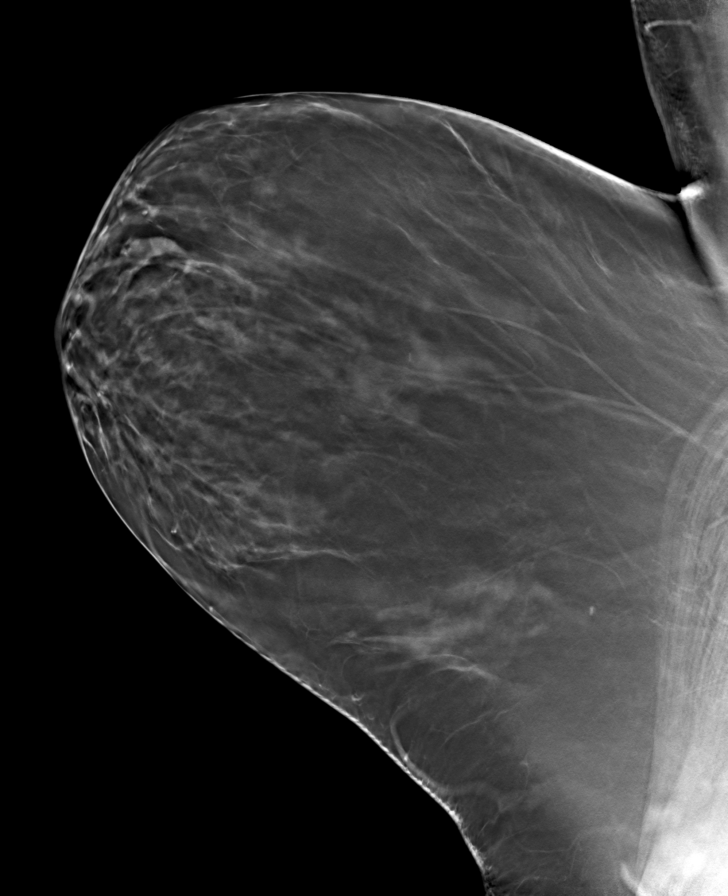

[R MLO tomo · tomo slice 47/94.0]
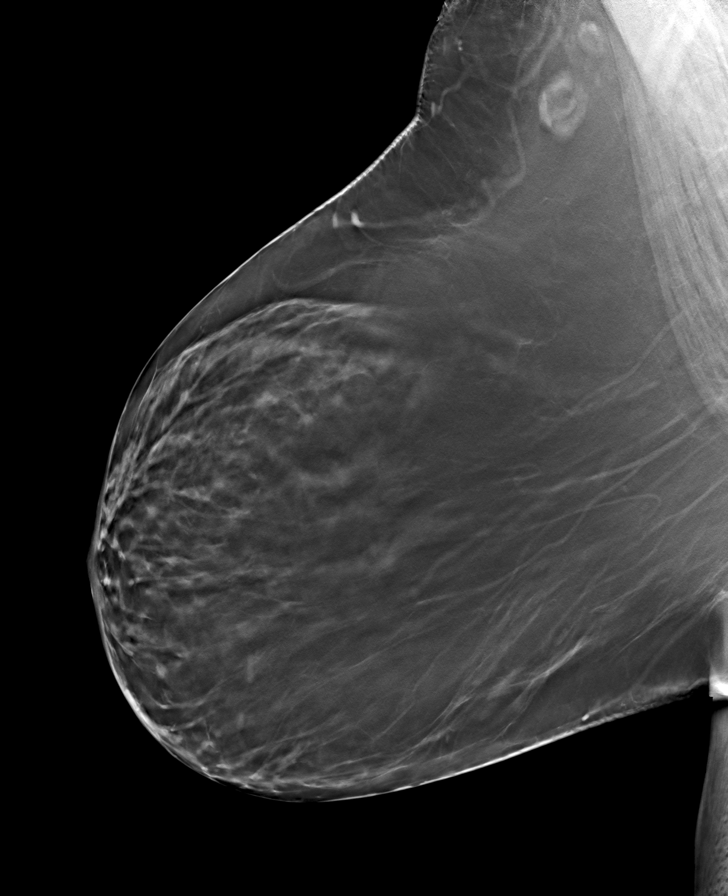

[R CC tomo · tomo slice 41/80.0]
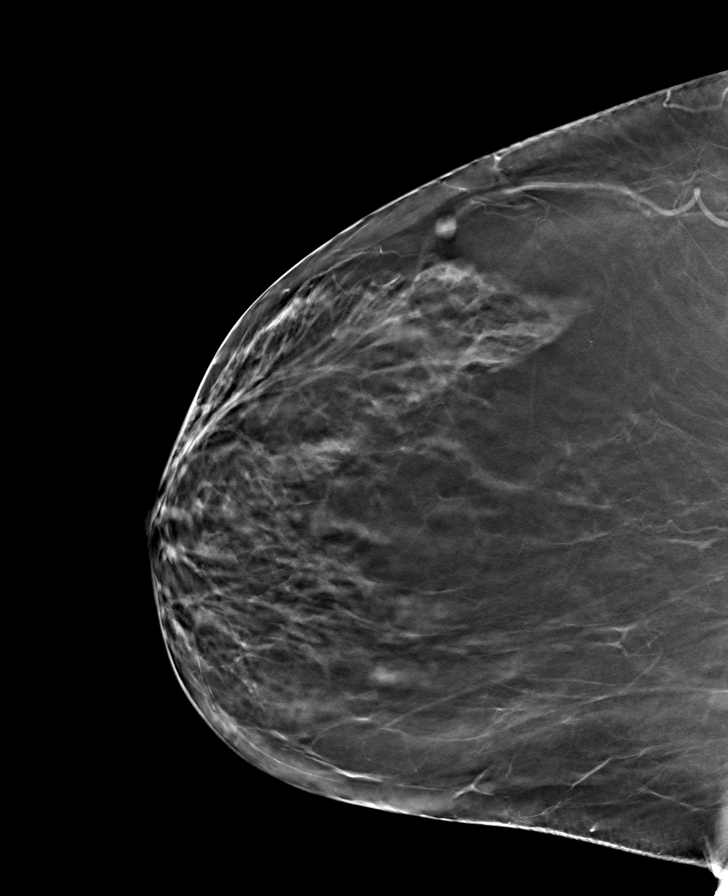

[L CC tomo · tomo slice 43/84.0]
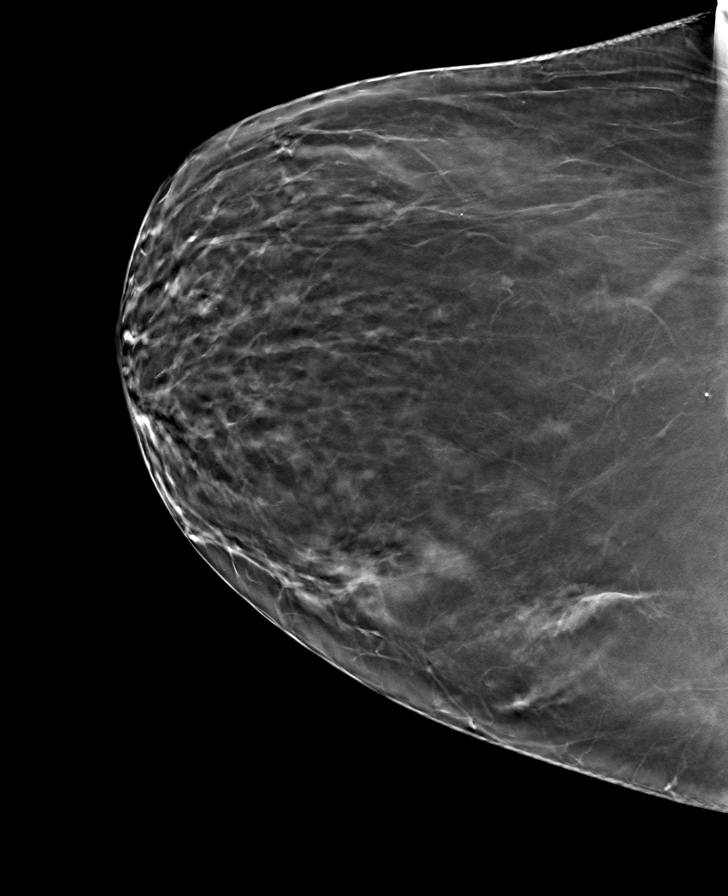

[8 of 24 positions shown; findings below may reference images not displayed]

ACR Breast Density Category b: There are scattered areas of
fibroglandular density.
FINDINGS: There are no findings suspicious for malignancy. Images were
processed with CAD.
IMPRESSION: No mammographic evidence of malignancy. A result letter of this
screening mammogram will be mailed directly to the patient.

RECOMMENDATION:
Screening mammogram in one year. (Code:CN-U-775)

BI-RADS CATEGORY  1: Negative.

## 2020-01-10 ENCOUNTER — Other Ambulatory Visit: Payer: Self-pay

## 2020-01-10 ENCOUNTER — Ambulatory Visit: Payer: BC Managed Care – PPO | Admitting: Family

## 2020-01-10 ENCOUNTER — Ambulatory Visit (INDEPENDENT_AMBULATORY_CARE_PROVIDER_SITE_OTHER): Payer: BC Managed Care – PPO | Admitting: Family

## 2020-01-10 ENCOUNTER — Encounter: Payer: Self-pay | Admitting: Family

## 2020-01-10 DIAGNOSIS — R202 Paresthesia of skin: Secondary | ICD-10-CM

## 2020-01-10 DIAGNOSIS — R42 Dizziness and giddiness: Secondary | ICD-10-CM | POA: Diagnosis not present

## 2020-01-10 DIAGNOSIS — I10 Essential (primary) hypertension: Secondary | ICD-10-CM | POA: Diagnosis not present

## 2020-01-10 DIAGNOSIS — K219 Gastro-esophageal reflux disease without esophagitis: Secondary | ICD-10-CM | POA: Diagnosis not present

## 2020-01-10 DIAGNOSIS — R200 Anesthesia of skin: Secondary | ICD-10-CM | POA: Diagnosis not present

## 2020-01-10 LAB — B12 AND FOLATE PANEL
Folate: 12 ng/mL (ref >5.9)
Vitamin B-12: 328 pg/mL (ref 211–911)

## 2020-01-10 NOTE — Assessment & Plan Note (Addendum)
New. Suspect radial/ulnar nerve impingement. Primarily noted Right 3rd and 4th dip. Pending b12 and folate.

## 2020-01-10 NOTE — Progress Notes (Signed)
Subjective:    Patient ID: Ashlee Bass, female    DOB: 1961/07/30, 59 y.o.   MRN: [id number]  CC: Ashlee Bass is a 59 y.o. female who presents today for follow up.   HPI: Hypertension-taken Lopressor twice daily.  Has not taken amlodipine 134/67 at home. Uses discretion salt.   Throat tightness- resolved on pepcid. No episode since starting medication. No trouble swallowing. BItterness taste in mouth has imrpoved.   Complains of numbness, bilateral fingers tips .more noticeable in right 3rd and 4th, started 4 weeks, unchanged. . Had head down and on phone typing for in when noticed fingertips were numb. Right handed.  Last night noticed right arm felt numb, she was dizzy at that time; episode lasted one minute. NO vision changrs,  exertional chest pain or pressure, diaphoresis, radiating to left arm or jaw, palpitations,frequent headaches, changes in vision, or shortness of breath.   Poor water intake. May hae  Notes occasional stiffness in neck.     HISTORY:  Past Medical History:  Diagnosis Date  . Allergy   . Asthma   . GERD (gastroesophageal reflux disease)   . Hyperlipidemia   . Hypertension   . Kidney stones   . Urinary tract infection    History reviewed. No pertinent surgical history. Family History  Problem Relation Age of Onset  . Arthritis Mother   . Hyperlipidemia Mother   . Heart disease Mother        Heart Failure  . Hypertension Mother   . Diabetes Mother   . COPD Mother   . Arthritis Father   . Heart disease Father   . Hyperlipidemia Father   . Hypertension Father   . Kidney disease Father        ESRD - dialysis  . Arthritis Paternal Grandmother   . Breast cancer Paternal Grandmother     Allergies: Codeine, Penicillins, Telithromycin, and Azithromycin Current Outpatient Medications on File Prior to Visit  Medication Sig Dispense Refill  . atorvastatin (LIPITOR) 20 MG tablet Take 1 tablet (20 mg total) by mouth daily at 6  PM. 90 tablet 1  . famotidine (PEPCID) 20 MG tablet Take 1 tablet (20 mg total) by mouth 2 (two) times daily. 120 tablet 1  . metoprolol tartrate (LOPRESSOR) 50 MG tablet Take 1 tablet (50 mg total) by mouth 2 (two) times daily. 180 tablet 3  . montelukast (SINGULAIR) 10 MG tablet Take 1 tablet (10 mg total) by mouth at bedtime. 90 tablet 1  . traZODone (DESYREL) 50 MG tablet Take 1.5 tablets (75 mg total) by mouth at bedtime as needed. for sleep 90 tablet 0   No current facility-administered medications on file prior to visit.    Social History   Tobacco Use  . Smoking status: Never Smoker  . Smokeless tobacco: Never Used  Substance Use Topics  . Alcohol use: No    Alcohol/week: 0.0 standard drinks  . Drug use: No    Review of Systems  Constitutional: Negative for chills and fever.  HENT: Negative for trouble swallowing and voice change.   Eyes: Positive for visual disturbance.  Respiratory: Negative for cough.   Cardiovascular: Negative for chest pain and palpitations.  Gastrointestinal: Negative for nausea and vomiting.  Neurological: Positive for dizziness. Negative for tremors, syncope, facial asymmetry, weakness and headaches.      Objective:    BP 130/68   Pulse 72   Temp 98 F (36.7 C)   Ht 5' 1.5" (1.562 m)  Wt 200 lb 9.6 oz (91 kg)   SpO2 98%   BMI 37.29 kg/m  BP Readings from Last 3 Encounters:  01/10/20 130/68  11/29/19 138/70  07/27/18 (!) 144/86   Wt Readings from Last 3 Encounters:  01/10/20 200 lb 9.6 oz (91 kg)  11/29/19 199 lb 3.2 oz (90.4 kg)  07/27/18 211 lb (95.7 kg)    Physical Exam Vitals reviewed.  Constitutional:      Appearance: She is well-developed.  HENT:     Mouth/Throat:     Pharynx: Uvula midline.  Eyes:     Conjunctiva/sclera: Conjunctivae normal.     Pupils: Pupils are equal, round, and reactive to light.     Comments: Fundus normal bilaterally.   Cardiovascular:     Rate and Rhythm: Normal rate and regular rhythm.      Pulses: Normal pulses.     Heart sounds: Normal heart sounds.  Pulmonary:     Effort: Pulmonary effort is normal.     Breath sounds: Normal breath sounds. No wheezing, rhonchi or rales.  Skin:    General: Skin is warm and dry.  Neurological:     Mental Status: She is alert.     Cranial Nerves: No cranial nerve deficit.     Sensory: No sensory deficit.     Deep Tendon Reflexes:     Reflex Scores:      Bicep reflexes are 2+ on the right side and 2+ on the left side.      Patellar reflexes are 2+ on the right side and 2+ on the left side.    Comments: Grip equal and strong bilateral upper extremities. Gait strong and steady. Able to perform rapid alternating movement without difficulty.   Psychiatric:        Speech: Speech normal.        Behavior: Behavior normal.        Thought Content: Thought content normal.        Assessment & Plan:   Problem List Items Addressed This Visit      Cardiovascular and Mediastinum   Essential hypertension    Improved. Due to dizziness episode and DBP being on the low end, we agreed to continue regimen without addition of amlodipine.         Digestive   GERD (gastroesophageal reflux disease)    Throat tightness resolved. She doesn't feel GI is necessary at this point. Will continue pepcid ac and let me know of any concerns.         Other   Dizziness    Self limiting episode yesterday. She is not Orthostatic however suspect dehydration may be playing a role.  Reassuring neurologic exam. Patient does not feel CT head is necessary at this time , which I think appropriate based on the absence of symptoms today and normal neurologic exam.  I given her strict return precautions that if she becomes dizzy again and certainly if associated with confusion, vision changes headache, left arm numbness, chest pain or palpitations, she will call 911 or go to emergency room.      Numbness and tingling    New. Suspect radial/ulnar nerve impingement.  Primarily noted Right 3rd and 4th dip. Pending b12 and folate.       Relevant Orders   B12 and Folate Panel       I have discontinued Ashlee Bass's amLODipine. I am also having her maintain her montelukast, metoprolol tartrate, traZODone, famotidine, and atorvastatin.   No orders of  the defined types were placed in this encounter.   Return precautions given.   Risks, benefits, and alternatives of the medications and treatment plan prescribed today were discussed, and patient expressed understanding.   Education regarding symptom management and diagnosis given to patient on AVS.  Continue to follow with Allegra Grana, FNP for routine health maintenance.   Ashlee Bass and I agreed with plan.   Rennie Plowman, FNP

## 2020-01-10 NOTE — Assessment & Plan Note (Signed)
Throat tightness resolved. She doesn't feel GI is necessary at this point. Will continue pepcid ac and let me know of any concerns.

## 2020-01-10 NOTE — Assessment & Plan Note (Signed)
Self limiting episode yesterday. She is not Orthostatic however suspect dehydration may be playing a role.  Reassuring neurologic exam. Patient does not feel CT head is necessary at this time , which I think appropriate based on the absence of symptoms today and normal neurologic exam.  I given her strict return precautions that if she becomes dizzy again and certainly if associated with confusion, vision changes headache, left arm numbness, chest pain or palpitations, she will call 911 or go to emergency room.

## 2020-01-10 NOTE — Patient Instructions (Signed)
Suspect carpal tunnel may be playing a role in dizziness. Information below as well.   Please stay very vigilant in regards to your dizziness. Please increase water and be careful with position changes especially since on a blood pressure medication.   If you experience another episode dizziness, and certainly if associated with headache, vision changes, chest pain, left arm numbness. Please call 911. For you knowledge, I have included information regarding stroke. Please ensure you are educated regarding this.  Dizziness Dizziness is a common problem. It is a feeling of unsteadiness or light-headedness. You may feel like you are about to faint. Dizziness can lead to injury if you stumble or fall. Anyone can become dizzy, but dizziness is more common in older adults. This condition can be caused by a number of things, including medicines, dehydration, or illness. Follow these instructions at home: Eating and drinking  Drink enough fluid to keep your urine clear or pale yellow. This helps to keep you from becoming dehydrated. Try to drink more clear fluids, such as water.  Do not drink alcohol.  Limit your caffeine intake if told to do so by your health care provider. Check ingredients and nutrition facts to see if a food or beverage contains caffeine.  Limit your salt (sodium) intake if told to do so by your health care provider. Check ingredients and nutrition facts to see if a food or beverage contains sodium. Activity  Avoid making quick movements. ? Rise slowly from chairs and steady yourself until you feel okay. ? In the morning, first sit up on the side of the bed. When you feel okay, stand slowly while you hold onto something until you know that your balance is fine.  If you need to stand in one place for a long time, move your legs often. Tighten and relax the muscles in your legs while you are standing.  Do not drive or use heavy machinery if you feel dizzy.  Avoid bending down if  you feel dizzy. Place items in your home so that they are easy for you to reach without leaning over. Lifestyle  Do not use any products that contain nicotine or tobacco, such as cigarettes and e-cigarettes. If you need help quitting, ask your health care provider.  Try to reduce your stress level by using methods such as yoga or meditation. Talk with your health care provider if you need help to manage your stress. General instructions  Watch your dizziness for any changes.  Take over-the-counter and prescription medicines only as told by your health care provider. Talk with your health care provider if you think that your dizziness is caused by a medicine that you are taking.  Tell a friend or a family member that you are feeling dizzy. If he or she notices any changes in your behavior, have this person call your health care provider.  Keep all follow-up visits as told by your health care provider. This is important. Contact a health care provider if:  Your dizziness does not go away.  Your dizziness or light-headedness gets worse.  You feel nauseous.  You have reduced hearing.  You have new symptoms.  You are unsteady on your feet or you feel like the room is spinning. Get help right away if:  You vomit or have diarrhea and are unable to eat or drink anything.  You have problems talking, walking, swallowing, or using your arms, hands, or legs.  You feel generally weak.  You are not thinking clearly or  you have trouble forming sentences. It may take a friend or family member to notice this.  You have chest pain, abdominal pain, shortness of breath, or sweating.  Your vision changes.  You have any bleeding.  You have a severe headache.  You have neck pain or a stiff neck.  You have a fever. These symptoms may represent a serious problem that is an emergency. Do not wait to see if the symptoms will go away. Get medical help right away. Call your local emergency  services (911 in the [place] Do not drive yourself to the hospital. Summary  Dizziness is a feeling of unsteadiness or light-headedness. This condition can be caused by a number of things, including medicines, dehydration, or illness.  Anyone can become dizzy, but dizziness is more common in older adults.  Drink enough fluid to keep your urine clear or pale yellow. Do not drink alcohol.  Avoid making quick movements if you feel dizzy. Monitor your dizziness for any changes. This information is not intended to replace advice given to you by your health care provider. Make sure you discuss any questions you have with your health care provider. Document Revised: 10/13/2017 Document Reviewed: 11/12/2016   Carpal Tunnel Syndrome  Carpal tunnel syndrome is a condition that causes pain in your hand and arm. The carpal tunnel is a narrow area that is on the palm side of your wrist. Repeated wrist motion or certain diseases may cause swelling in the tunnel. This swelling can pinch the main nerve in the wrist (median nerve). What are the causes? This condition may be caused by:  Repeated wrist motions.  Wrist injuries.  Arthritis.  A sac of fluid (cyst) or abnormal growth (tumor) in the carpal tunnel.  Fluid buildup during pregnancy. Sometimes the cause is not known. What increases the risk? The following factors may make you more likely to develop this condition:  Having a job in which you move your wrist in the same way many times. This includes jobs like being a Midwife or a Conservation officer, nature.  Being a woman.  Having other health conditions, such as: ? Diabetes. ? Obesity. ? A thyroid gland that is not active enough (hypothyroidism). ? Kidney failure. What are the signs or symptoms? Symptoms of this condition include:  A tingling feeling in your fingers.  Tingling or a loss of feeling (numbness) in your hand.  Pain in your entire arm. This pain may get worse when you bend your wrist  and elbow for a long time.  Pain in your wrist that goes up your arm to your shoulder.  Pain that goes down into your palm or fingers.  A weak feeling in your hands. You may find it hard to grab and hold items. You may feel worse at night. How is this diagnosed? This condition is diagnosed with a medical history and physical exam. You may also have tests, such as:  Electromyogram (EMG). This test checks the signals that the nerves send to the muscles.  Nerve conduction study. This test checks how well signals pass through your nerves.  Imaging tests, such as X-rays, ultrasound, and MRI. These tests check for what might be the cause of your condition. How is this treated? This condition may be treated with:  Lifestyle changes. You will be asked to stop or change the activity that caused your problem.  Doing exercise and activities that make bones and muscles stronger (physical therapy).  Learning how to use your hand again (occupational therapy).  Medicines for pain and swelling (inflammation). You may have injections in your wrist.  A wrist splint.  Surgery. Follow these instructions at home: If you have a splint:  Wear the splint as told by your doctor. Remove it only as told by your doctor.  Loosen the splint if your fingers: ? Tingle. ? Lose feeling (become numb). ? Turn cold and blue.  Keep the splint clean.  If the splint is not waterproof: ? Do not let it get wet. ? Cover it with a watertight covering when you take a bath or a shower. Managing pain, stiffness, and swelling   If told, put ice on the painful area: ? If you have a removable splint, remove it as told by your doctor. ? Put ice in a plastic bag. ? Place a towel between your skin and the bag. ? Leave the ice on for 20 minutes, 2-3 times per day. General instructions  Take over-the-counter and prescription medicines only as told by your doctor.  Rest your wrist from any activity that may cause  pain. If needed, talk with your boss at work about changes that can help your wrist heal.  Do any exercises as told by your doctor, physical therapist, or occupational therapist.  Keep all follow-up visits as told by your doctor. This is important. Contact a doctor if:  You have new symptoms.  Medicine does not help your pain.  Your symptoms get worse. Get help right away if:  You have very bad numbness or tingling in your wrist or hand. Summary  Carpal tunnel syndrome is a condition that causes pain in your hand and arm.  It is often caused by repeated wrist motions.  Lifestyle changes and medicines are used to treat this problem. Surgery may help in very bad cases.  Follow your doctor's instructions about wearing a splint, resting your wrist, keeping follow-up visits, and calling for help. This information is not intended to replace advice given to you by your health care provider. Make sure you discuss any questions you have with your health care provider. Document Revised: 02/16/2018 Document Reviewed: 02/16/2018 Elsevier Patient Education  2020 [employer].  Elsevier Patient Education  [employer].

## 2020-01-10 NOTE — Assessment & Plan Note (Signed)
Improved. Due to dizziness episode and DBP being on the low end, we agreed to continue regimen without addition of amlodipine.

## 2020-01-15 ENCOUNTER — Other Ambulatory Visit: Payer: Self-pay | Admitting: Family

## 2020-01-15 ENCOUNTER — Encounter: Payer: Self-pay | Admitting: Family

## 2020-01-15 DIAGNOSIS — R200 Anesthesia of skin: Secondary | ICD-10-CM

## 2020-01-15 DIAGNOSIS — R202 Paresthesia of skin: Secondary | ICD-10-CM

## 2020-02-28 DIAGNOSIS — G5603 Carpal tunnel syndrome, bilateral upper limbs: Secondary | ICD-10-CM | POA: Diagnosis not present

## 2020-03-22 ENCOUNTER — Other Ambulatory Visit: Payer: Self-pay | Admitting: Family

## 2020-03-22 DIAGNOSIS — G4700 Insomnia, unspecified: Secondary | ICD-10-CM

## 2020-04-18 ENCOUNTER — Other Ambulatory Visit: Payer: Self-pay | Admitting: Family

## 2020-04-18 DIAGNOSIS — K219 Gastro-esophageal reflux disease without esophagitis: Secondary | ICD-10-CM

## 2020-05-31 ENCOUNTER — Other Ambulatory Visit: Payer: Self-pay | Admitting: Family

## 2020-05-31 DIAGNOSIS — G4700 Insomnia, unspecified: Secondary | ICD-10-CM

## 2020-06-01 MED ORDER — TRAZODONE HCL 50 MG PO TABS: 50.0000 mg | ORAL_TABLET | Freq: Every day | ORAL | 0 refills | Status: DC

## 2020-06-09 ENCOUNTER — Other Ambulatory Visit: Payer: Self-pay | Admitting: Family

## 2020-06-09 DIAGNOSIS — R062 Wheezing: Secondary | ICD-10-CM

## 2020-06-09 DIAGNOSIS — J4530 Mild persistent asthma, uncomplicated: Secondary | ICD-10-CM

## 2020-06-09 DIAGNOSIS — J302 Other seasonal allergic rhinitis: Secondary | ICD-10-CM

## 2020-06-27 ENCOUNTER — Other Ambulatory Visit: Payer: Self-pay | Admitting: Family

## 2020-06-27 DIAGNOSIS — E785 Hyperlipidemia, unspecified: Secondary | ICD-10-CM

## 2020-06-27 DIAGNOSIS — K219 Gastro-esophageal reflux disease without esophagitis: Secondary | ICD-10-CM

## 2020-07-01 DIAGNOSIS — Z20822 Contact with and (suspected) exposure to covid-19: Secondary | ICD-10-CM | POA: Diagnosis not present

## 2020-08-14 ENCOUNTER — Other Ambulatory Visit: Payer: Self-pay | Admitting: Family

## 2020-08-14 DIAGNOSIS — Z1231 Encounter for screening mammogram for malignant neoplasm of breast: Secondary | ICD-10-CM

## 2020-09-02 ENCOUNTER — Other Ambulatory Visit: Payer: Self-pay | Admitting: Family

## 2020-09-02 DIAGNOSIS — R062 Wheezing: Secondary | ICD-10-CM

## 2020-09-02 DIAGNOSIS — J302 Other seasonal allergic rhinitis: Secondary | ICD-10-CM

## 2020-09-02 DIAGNOSIS — J4530 Mild persistent asthma, uncomplicated: Secondary | ICD-10-CM

## 2020-09-02 DIAGNOSIS — K219 Gastro-esophageal reflux disease without esophagitis: Secondary | ICD-10-CM

## 2020-09-02 DIAGNOSIS — G4700 Insomnia, unspecified: Secondary | ICD-10-CM

## 2020-09-04 DIAGNOSIS — J019 Acute sinusitis, unspecified: Secondary | ICD-10-CM | POA: Diagnosis not present

## 2020-09-04 DIAGNOSIS — R059 Cough, unspecified: Secondary | ICD-10-CM | POA: Diagnosis not present

## 2020-09-04 DIAGNOSIS — R509 Fever, unspecified: Secondary | ICD-10-CM | POA: Diagnosis not present

## 2020-09-15 ENCOUNTER — Encounter: Payer: Self-pay | Admitting: Family

## 2020-09-16 ENCOUNTER — Other Ambulatory Visit: Payer: Self-pay

## 2020-09-25 ENCOUNTER — Ambulatory Visit
Admission: RE | Admit: 2020-09-25 | Discharge: 2020-09-25 | Disposition: A | Discharge status: Home / Self Care | Payer: BC Managed Care – PPO | Source: Ambulatory Visit | Attending: Family | Admitting: Family

## 2020-09-25 ENCOUNTER — Other Ambulatory Visit: Payer: Self-pay

## 2020-09-25 DIAGNOSIS — Z1231 Encounter for screening mammogram for malignant neoplasm of breast: Secondary | ICD-10-CM | POA: Insufficient documentation

## 2020-10-04 DIAGNOSIS — J069 Acute upper respiratory infection, unspecified: Secondary | ICD-10-CM | POA: Diagnosis not present

## 2020-10-04 DIAGNOSIS — Z03818 Encounter for observation for suspected exposure to other biological agents ruled out: Secondary | ICD-10-CM | POA: Diagnosis not present

## 2020-10-23 DIAGNOSIS — Z20828 Contact with and (suspected) exposure to other viral communicable diseases: Secondary | ICD-10-CM | POA: Diagnosis not present

## 2020-10-23 DIAGNOSIS — U071 COVID-19: Secondary | ICD-10-CM | POA: Diagnosis not present

## 2020-10-23 DIAGNOSIS — J209 Acute bronchitis, unspecified: Secondary | ICD-10-CM | POA: Diagnosis not present

## 2020-10-23 DIAGNOSIS — B9689 Other specified bacterial agents as the cause of diseases classified elsewhere: Secondary | ICD-10-CM | POA: Diagnosis not present

## 2020-10-23 DIAGNOSIS — J019 Acute sinusitis, unspecified: Secondary | ICD-10-CM | POA: Diagnosis not present

## 2020-10-31 ENCOUNTER — Other Ambulatory Visit: Payer: Self-pay | Admitting: Family

## 2020-10-31 DIAGNOSIS — K219 Gastro-esophageal reflux disease without esophagitis: Secondary | ICD-10-CM

## 2020-10-31 DIAGNOSIS — E785 Hyperlipidemia, unspecified: Secondary | ICD-10-CM

## 2020-10-31 DIAGNOSIS — G4700 Insomnia, unspecified: Secondary | ICD-10-CM

## 2020-11-19 ENCOUNTER — Telehealth: Payer: Self-pay | Admitting: Family

## 2020-11-19 DIAGNOSIS — I10 Essential (primary) hypertension: Secondary | ICD-10-CM

## 2020-11-19 MED ORDER — METOPROLOL TARTRATE 50 MG PO TABS: 50.0000 mg | ORAL_TABLET | Freq: Two times a day (BID) | ORAL | 3 refills | Status: DC

## 2020-11-19 NOTE — Telephone Encounter (Signed)
Pt needs a refill on metoprolol tartrate (LOPRESSOR) 50 MG tablet sent to [facility]

## 2021-01-18 ENCOUNTER — Other Ambulatory Visit: Payer: Self-pay | Admitting: Family

## 2021-01-18 DIAGNOSIS — K219 Gastro-esophageal reflux disease without esophagitis: Secondary | ICD-10-CM

## 2021-02-21 ENCOUNTER — Other Ambulatory Visit: Payer: Self-pay | Admitting: Family

## 2021-02-21 DIAGNOSIS — E785 Hyperlipidemia, unspecified: Secondary | ICD-10-CM

## 2021-02-21 DIAGNOSIS — R062 Wheezing: Secondary | ICD-10-CM

## 2021-02-21 DIAGNOSIS — G4700 Insomnia, unspecified: Secondary | ICD-10-CM

## 2021-02-21 DIAGNOSIS — J4530 Mild persistent asthma, uncomplicated: Secondary | ICD-10-CM

## 2021-02-21 DIAGNOSIS — J302 Other seasonal allergic rhinitis: Secondary | ICD-10-CM

## 2021-05-14 DIAGNOSIS — M7531 Calcific tendinitis of right shoulder: Secondary | ICD-10-CM | POA: Insufficient documentation

## 2021-05-14 DIAGNOSIS — S63501A Unspecified sprain of right wrist, initial encounter: Secondary | ICD-10-CM | POA: Diagnosis not present

## 2021-05-18 ENCOUNTER — Other Ambulatory Visit: Payer: Self-pay | Admitting: Family

## 2021-05-18 DIAGNOSIS — J4530 Mild persistent asthma, uncomplicated: Secondary | ICD-10-CM

## 2021-05-18 DIAGNOSIS — G4700 Insomnia, unspecified: Secondary | ICD-10-CM

## 2021-05-18 DIAGNOSIS — E785 Hyperlipidemia, unspecified: Secondary | ICD-10-CM

## 2021-05-18 DIAGNOSIS — J302 Other seasonal allergic rhinitis: Secondary | ICD-10-CM

## 2021-05-18 DIAGNOSIS — R062 Wheezing: Secondary | ICD-10-CM

## 2021-05-28 DIAGNOSIS — M7531 Calcific tendinitis of right shoulder: Secondary | ICD-10-CM | POA: Diagnosis not present

## 2021-07-29 ENCOUNTER — Telehealth: Payer: Self-pay | Admitting: Family

## 2021-07-29 NOTE — Telephone Encounter (Signed)
Called to speak with Seva. Nasreen states that she believes that she has Bronchitis. Pt states that she is having allergy like symptoms like congestion and body aches. Pt states that she is having trouble taking deep breaths. Pt states that she does get SOB when she is rushing or moving too fast. Pt states that she had believed that the symptoms were due to allergies but started to feel chest tightness and wanted to be evaluated. Pt was informed that margaret was not in office today and that she would want her to be evaluated as soon as possible for her SOB. Pt was instructed to go to [facility] [place] for an in person evaluation. Pt verbalized understanding and had no further questions. She states that she will go either go there or to [facility] or tomorrow as she is off work on 07/30/21.

## 2021-07-29 NOTE — Telephone Encounter (Signed)
Patient asthma is flaring up. No appointments at office. Patient was transferred to  Access Nurse.

## 2021-07-29 NOTE — Telephone Encounter (Signed)
Providing access nurse documentation. Pt called into access nurse stating that their asthma is flaring up. Pt states that she gets bronchitis around this time of year and is having difficulties breathing. Pt was instructed to be evaluated within the next 3-4 hours and to re-contact PCP

## 2021-07-30 DIAGNOSIS — M791 Myalgia, unspecified site: Secondary | ICD-10-CM | POA: Diagnosis not present

## 2021-07-30 DIAGNOSIS — Z20822 Contact with and (suspected) exposure to covid-19: Secondary | ICD-10-CM | POA: Diagnosis not present

## 2021-08-15 DIAGNOSIS — H6123 Impacted cerumen, bilateral: Secondary | ICD-10-CM | POA: Diagnosis not present

## 2021-08-15 DIAGNOSIS — J069 Acute upper respiratory infection, unspecified: Secondary | ICD-10-CM | POA: Diagnosis not present

## 2021-08-17 ENCOUNTER — Other Ambulatory Visit: Payer: Self-pay | Admitting: Family

## 2021-08-17 DIAGNOSIS — J4530 Mild persistent asthma, uncomplicated: Secondary | ICD-10-CM

## 2021-08-17 DIAGNOSIS — R062 Wheezing: Secondary | ICD-10-CM

## 2021-08-17 DIAGNOSIS — J302 Other seasonal allergic rhinitis: Secondary | ICD-10-CM

## 2021-08-17 DIAGNOSIS — E785 Hyperlipidemia, unspecified: Secondary | ICD-10-CM

## 2021-08-17 DIAGNOSIS — G4700 Insomnia, unspecified: Secondary | ICD-10-CM

## 2021-08-20 ENCOUNTER — Other Ambulatory Visit (HOSPITAL_COMMUNITY)
Admission: RE | Admit: 2021-08-20 | Discharge: 2021-08-20 | Disposition: A | Discharge status: Home / Self Care | Payer: BC Managed Care – PPO | Source: Ambulatory Visit | Attending: Family | Admitting: Family

## 2021-08-20 ENCOUNTER — Other Ambulatory Visit: Payer: Self-pay

## 2021-08-20 ENCOUNTER — Encounter: Payer: Self-pay | Admitting: Family

## 2021-08-20 ENCOUNTER — Ambulatory Visit (INDEPENDENT_AMBULATORY_CARE_PROVIDER_SITE_OTHER): Payer: BC Managed Care – PPO | Admitting: Family

## 2021-08-20 VITALS — BP 128/84 | HR 66 | Temp 96.4°F | Ht 61.42 in | Wt 198.4 lb

## 2021-08-20 DIAGNOSIS — Z0000 Encounter for general adult medical examination without abnormal findings: Secondary | ICD-10-CM

## 2021-08-20 DIAGNOSIS — G4700 Insomnia, unspecified: Secondary | ICD-10-CM

## 2021-08-20 DIAGNOSIS — Z124 Encounter for screening for malignant neoplasm of cervix: Secondary | ICD-10-CM | POA: Diagnosis not present

## 2021-08-20 DIAGNOSIS — I10 Essential (primary) hypertension: Secondary | ICD-10-CM | POA: Diagnosis not present

## 2021-08-20 DIAGNOSIS — E785 Hyperlipidemia, unspecified: Secondary | ICD-10-CM

## 2021-08-20 LAB — COMPREHENSIVE METABOLIC PANEL
ALT: 28 U/L (ref 0–35)
AST: 24 U/L (ref 0–37)
Albumin: 4.1 g/dL (ref 3.5–5.2)
Alkaline Phosphatase: 97 U/L (ref 39–117)
BUN: 15 mg/dL (ref 6–23)
CO2: 28 mEq/L (ref 19–32)
Calcium: 10.1 mg/dL (ref 8.4–10.5)
Chloride: 106 mEq/L (ref 96–112)
Creatinine, Ser: 0.72 mg/dL (ref 0.40–1.20)
GFR: 91.72 mL/min (ref >60.00)
Glucose, Bld: 113 mg/dL — ABNORMAL HIGH (ref 70–99)
Potassium: 4.2 mEq/L (ref 3.5–5.1)
Sodium: 139 mEq/L (ref 135–145)
Total Bilirubin: 0.6 mg/dL (ref 0.2–1.2)
Total Protein: 6.7 g/dL (ref 6.0–8.3)

## 2021-08-20 LAB — CBC WITH DIFFERENTIAL/PLATELET
Basophils Absolute: 0 10*3/uL (ref 0.0–0.1)
Basophils Relative: 0.7 % (ref 0.0–3.0)
Eosinophils Absolute: 0.4 10*3/uL (ref 0.0–0.7)
Eosinophils Relative: 6.3 % — ABNORMAL HIGH (ref 0.0–5.0)
HCT: 40.6 % (ref 36.0–46.0)
Hemoglobin: 13.4 g/dL (ref 12.0–15.0)
Lymphocytes Relative: 24.6 % (ref 12.0–46.0)
Lymphs Abs: 1.5 10*3/uL (ref 0.7–4.0)
MCHC: 33.1 g/dL (ref 30.0–36.0)
MCV: 87.1 fl (ref 78.0–100.0)
Monocytes Absolute: 0.5 10*3/uL (ref 0.1–1.0)
Monocytes Relative: 7.5 % (ref 3.0–12.0)
Neutro Abs: 3.8 10*3/uL (ref 1.4–7.7)
Neutrophils Relative %: 60.9 % (ref 43.0–77.0)
Platelets: 283 10*3/uL (ref 150.0–400.0)
RBC: 4.66 Mil/uL (ref 3.87–5.11)
RDW: 14.7 % (ref 11.5–15.5)
WBC: 6.2 10*3/uL (ref 4.0–10.5)

## 2021-08-20 LAB — LIPID PANEL
Cholesterol: 169 mg/dL (ref 0–200)
HDL: 43.5 mg/dL (ref >39.00)
LDL Cholesterol: 94 mg/dL (ref 0–99)
NonHDL: 125.67
Total CHOL/HDL Ratio: 4
Triglycerides: 158 mg/dL — ABNORMAL HIGH (ref 0.0–149.0)
VLDL: 31.6 mg/dL (ref 0.0–40.0)

## 2021-08-20 LAB — HEMOGLOBIN A1C: Hgb A1c MFr Bld: 6.3 % (ref 4.6–6.5)

## 2021-08-20 LAB — TSH: TSH: 2.56 u[IU]/mL (ref 0.35–5.50)

## 2021-08-20 LAB — VITAMIN D 25 HYDROXY (VIT D DEFICIENCY, FRACTURES): VITD: 21.86 ng/mL — ABNORMAL LOW (ref 30.00–100.00)

## 2021-08-20 MED ORDER — TRAZODONE HCL 50 MG PO TABS: 100.0000 mg | ORAL_TABLET | Freq: Every day | ORAL | 0 refills | Status: DC

## 2021-08-20 NOTE — Assessment & Plan Note (Signed)
Anticipate stable.  Pending lipid panel today.  Continue Lipitor 20 mg

## 2021-08-20 NOTE — Assessment & Plan Note (Signed)
CBE and pap smear performed today. Encouraged walking program.

## 2021-08-20 NOTE — Patient Instructions (Addendum)
Shingrex and Flu AT DIFFERENT TIMES at local pharmacy.  Mammogram as scheduled  Health Maintenance for Postmenopausal Women Menopause is a normal process in which your ability to get pregnant comes to an end. This process happens slowly over many months or years, usually between the ages of 32 and 57. Menopause is complete when you have missed your menstrual periods for 12 months. It is important to talk with your health care provider about some of the most common conditions that affect women after menopause (postmenopausal women). These include heart disease, cancer, and bone loss (osteoporosis). Adopting a healthy lifestyle and getting preventive care can help to promote your health and wellness. The actions you take can also lower your chances of developing some of these common conditions. What should I know about menopause? During menopause, you may get a number of symptoms, such as: Hot flashes. These can be moderate or severe. Night sweats. Decrease in sex drive. Mood swings. Headaches. Tiredness. Irritability. Memory problems. Insomnia. Choosing to treat or not to treat these symptoms is a decision that you make with your health care provider. Do I need hormone replacement therapy? Hormone replacement therapy is effective in treating symptoms that are caused by menopause, such as hot flashes and night sweats. Hormone replacement carries certain risks, especially as you become older. If you are thinking about using estrogen or estrogen with progestin, discuss the benefits and risks with your health care provider. What is my risk for heart disease and stroke? The risk of heart disease, heart attack, and stroke increases as you age. One of the causes may be a change in the body's hormones during menopause. This can affect how your body uses dietary fats, triglycerides, and cholesterol. Heart attack and stroke are medical emergencies. There are many things that you can do to help prevent  heart disease and stroke. Watch your blood pressure High blood pressure causes heart disease and increases the risk of stroke. This is more likely to develop in people who have high blood pressure readings, are of African descent, or are overweight. Have your blood pressure checked: Every 3-5 years if you are 82-27 years of age. Every year if you are 51 years old or older. Eat a healthy diet  Eat a diet that includes plenty of vegetables, fruits, low-fat dairy products, and lean protein. Do not eat a lot of foods that are high in solid fats, added sugars, or sodium. Get regular exercise Get regular exercise. This is one of the most important things you can do for your health. Most adults should: Try to exercise for at least 150 minutes each week. The exercise should increase your heart rate and make you sweat (moderate-intensity exercise). Try to do strengthening exercises at least twice each week. Do these in addition to the moderate-intensity exercise. Spend less time sitting. Even light physical activity can be beneficial. Other tips Work with your health care provider to achieve or maintain a healthy weight. Do not use any products that contain nicotine or tobacco, such as cigarettes, e-cigarettes, and chewing tobacco. If you need help quitting, ask your health care provider. Know your numbers. Ask your health care provider to check your cholesterol and your blood sugar (glucose). Continue to have your blood tested as directed by your health care provider. Do I need screening for cancer? Depending on your health history and family history, you may need to have cancer screening at different stages of your life. This may include screening for: Breast cancer. Cervical cancer. Lung  cancer. Colorectal cancer. What is my risk for osteoporosis? After menopause, you may be at increased risk for osteoporosis. Osteoporosis is a condition in which bone destruction happens more quickly than new  bone creation. To help prevent osteoporosis or the bone fractures that can happen because of osteoporosis, you may take the following actions: If you are 1-55 years old, get at least 1,000 mg of calcium and at least 600 mg of vitamin D per day. If you are older than age 48 but younger than age 54, get at least 1,200 mg of calcium and at least 600 mg of vitamin D per day. If you are older than age 55, get at least 1,200 mg of calcium and at least 800 mg of vitamin D per day. Smoking and drinking excessive alcohol increase the risk of osteoporosis. Eat foods that are rich in calcium and vitamin D, and do weight-bearing exercises several times each week as directed by your health care provider. How does menopause affect my mental health? Depression may occur at any age, but it is more common as you become older. Common symptoms of depression include: Low or sad mood. Changes in sleep patterns. Changes in appetite or eating patterns. Feeling an overall lack of motivation or enjoyment of activities that you previously enjoyed. Frequent crying spells. Talk with your health care provider if you think that you are experiencing depression. General instructions See your health care provider for regular wellness exams and vaccines. This may include: Scheduling regular health, dental, and eye exams. Getting and maintaining your vaccines. These include: Influenza vaccine. Get this vaccine each year before the flu season begins. Pneumonia vaccine. Shingles vaccine. Tetanus, diphtheria, and pertussis (Tdap) booster vaccine. Your health care provider may also recommend other immunizations. Tell your health care provider if you have ever been abused or do not feel safe at home. Summary Menopause is a normal process in which your ability to get pregnant comes to an end. This condition causes hot flashes, night sweats, decreased interest in sex, mood swings, headaches, or lack of sleep. Treatment for this  condition may include hormone replacement therapy. Take actions to keep yourself healthy, including exercising regularly, eating a healthy diet, watching your weight, and checking your blood pressure and blood sugar levels. Get screened for cancer and depression. Make sure that you are up to date with all your vaccines. This information is not intended to replace advice given to you by your health care provider. Make sure you discuss any questions you have with your health care provider. Document Revised: 10/03/2018 Document Reviewed: 10/03/2018 Elsevier Patient Education  2022 [employer].

## 2021-08-20 NOTE — Assessment & Plan Note (Signed)
Chronic, stable.  Continue Lopressor 50 mg BID

## 2021-08-20 NOTE — Progress Notes (Signed)
Subjective:    Patient ID: Ashlee Bass, female    DOB: [birth year], 60 y.o.   MRN: [id number]  CC: Ashlee Bass is a 60 y.o. female who presents today for physical exam and medication follow up.    HPI: Feels well today No complaints.    HTN- compliant with Lopressor 50 mg BID. No cp, sob Hyperlipidemia-compliant with Lipitor 20 mg Insomnia-compliant with trazodone $RemoveBefore'50mg'[id number]$ . At times she wakes up in middle of night.   Colorectal Cancer Screening: UTD , reported in 2015.  Result is not in epic.  Per patient repeat 10 years Breast Cancer Screening: Mammogram UTD Cervical Cancer Screening: due.  Denies pelvic pain or vaginal bleeding Bone Health screening/DEXA for 65+: No increased fracture risk. Defer screening at this time.  Lung Cancer Screening: Doesn't have 20 year pack year history and age > 82 years yo 83 years        Tetanus - utd         Hepatitis C screening - Candidate for , consents HIV Screening- Candidate for, consents Labs: Screening labs today. Exercise: no formal exercise.  She takes care of grandchildren.  Alcohol use:  none Smoking/tobacco use: former smoker.     HISTORY:  Past Medical History:  Diagnosis Date   Allergy    Asthma    GERD (gastroesophageal reflux disease)    Hyperlipidemia    Hypertension    Kidney stones    Urinary tract infection     History reviewed. No pertinent surgical history. Family History  Problem Relation Age of Onset   Arthritis Mother    Hyperlipidemia Mother    Heart disease Mother        Heart Failure   Hypertension Mother    Diabetes Mother    COPD Mother    Arthritis Father    Heart disease Father    Hyperlipidemia Father    Hypertension Father    Kidney disease Father        ESRD - dialysis   Arthritis Paternal Grandmother    Breast cancer Paternal Grandmother       ALLERGIES: Codeine, Penicillins, Telithromycin, and Azithromycin  Current Outpatient Medications on File Prior to Visit   Medication Sig Dispense Refill   atorvastatin (LIPITOR) 20 MG tablet TAKE 1 TABLET BY MOUTH ONCE DAILY AT 6 PM 90 tablet 0   doxycycline (VIBRAMYCIN) 100 MG capsule Take 100 mg by mouth 2 (two) times daily.     famotidine (PEPCID) 20 MG tablet Take 1 tablet by mouth twice daily 120 tablet 0   meloxicam (MOBIC) 15 MG tablet Take 15 mg by mouth daily.     methocarbamol (ROBAXIN) 500 MG tablet Take 500 mg by mouth 2 (two) times daily.     metoprolol tartrate (LOPRESSOR) 50 MG tablet Take 1 tablet (50 mg total) by mouth 2 (two) times daily. 180 tablet 3   montelukast (SINGULAIR) 10 MG tablet TAKE 1 TABLET BY MOUTH AT BEDTIME 90 tablet 0   No current facility-administered medications on file prior to visit.    Social History   Tobacco Use   Smoking status: Former    Types: Cigarettes    Quit date: 10/24/1980    Years since quitting: 40.8   Smokeless tobacco: Never  Substance Use Topics   Alcohol use: No    Alcohol/week: 0.0 standard drinks   Drug use: No    Review of Systems  Constitutional:  Negative for chills, fever and unexpected weight change.  HENT:  Negative for congestion.   Respiratory:  Negative for cough.   Cardiovascular:  Negative for chest pain, palpitations and leg swelling.  Gastrointestinal:  Negative for nausea and vomiting.  Genitourinary:  Negative for pelvic pain and vaginal bleeding.  Musculoskeletal:  Negative for arthralgias and myalgias.  Skin:  Negative for rash.  Neurological:  Negative for headaches.  Hematological:  Negative for adenopathy.  Psychiatric/Behavioral:  Negative for confusion.      Objective:    BP 128/84   Pulse 66   Temp (!) 96.4 F (35.8 C)   Ht 5' 1.42" (1.56 m)   Wt 198 lb 6.4 oz (90 kg)   SpO2 95%   BMI 36.98 kg/m   BP Readings from Last 3 Encounters:  08/20/21 128/84  01/10/20 130/68  11/29/19 138/70   Wt Readings from Last 3 Encounters:  08/20/21 198 lb 6.4 oz (90 kg)  01/10/20 200 lb 9.6 oz (91 kg)  11/29/19 199  lb 3.2 oz (90.4 kg)    Physical Exam Vitals reviewed.  Constitutional:      Appearance: Normal appearance. She is well-developed.  Eyes:     Conjunctiva/sclera: Conjunctivae normal.  Neck:     Thyroid: No thyroid mass or thyromegaly.  Cardiovascular:     Rate and Rhythm: Normal rate and regular rhythm.     Pulses: Normal pulses.     Heart sounds: Normal heart sounds.  Pulmonary:     Effort: Pulmonary effort is normal.     Breath sounds: Normal breath sounds. No wheezing, rhonchi or rales.  Chest:  Breasts:    Breasts are symmetrical.     Right: No inverted nipple, mass, nipple discharge, skin change or tenderness.     Left: No inverted nipple, mass, nipple discharge, skin change or tenderness.  Abdominal:     General: Bowel sounds are normal. There is no distension.     Palpations: Abdomen is soft. Abdomen is not rigid. There is no fluid wave or mass.     Tenderness: There is no abdominal tenderness. There is no guarding or rebound.  Genitourinary:    Cervix: No cervical motion tenderness, discharge or friability.     Uterus: Not enlarged, not fixed and not tender.      Adnexa:        Right: No mass, tenderness or fullness.         Left: No mass, tenderness or fullness.       Comments: Pap performed. No CMT. Unable to appreciated ovaries. Lymphadenopathy:     Head:     Right side of head: No submental, submandibular, tonsillar, preauricular, posterior auricular or occipital adenopathy.     Left side of head: No submental, submandibular, tonsillar, preauricular, posterior auricular or occipital adenopathy.     Cervical: No cervical adenopathy.     Right cervical: No superficial, deep or posterior cervical adenopathy.    Left cervical: No superficial, deep or posterior cervical adenopathy.     Upper Body:     Right upper body: No pectoral adenopathy.     Left upper body: No pectoral adenopathy.  Skin:    General: Skin is warm and dry.  Neurological:     Mental Status: She  is alert.  Psychiatric:        Speech: Speech normal.        Behavior: Behavior normal.        Thought Content: Thought content normal.       Assessment & Plan:   Problem List  Items Addressed This Visit       Cardiovascular and Mediastinum   Essential hypertension    Chronic, stable.  Continue Lopressor 50 mg BID        Other   Annual physical exam - Primary    CBE and pap smear performed today. Encouraged walking program.       Relevant Orders   HgB A1c   Lipid panel   TSH   Vitamin D (25 hydroxy)   Comp Met (CMET)   CBC w/Diff   Cytology - PAP( [place])   MM 3D SCREEN BREAST BILATERAL   HIV Antibody (routine testing w rflx)   Hepatitis C antibody   Hyperlipidemia    Anticipate stable.  Pending lipid panel today.  Continue Lipitor 20 mg      Insomnia    Suboptimal control.  Advised to increase trazodone to 100 mg.  She will let me know how she is doing on this dose      Relevant Medications   traZODone (DESYREL) 50 MG tablet   Other Visit Diagnoses     Cervical cancer screening       Relevant Orders   Cytology - PAP( [place])        I have changed Ashlee Bass's traZODone. I am also having her maintain her metoprolol tartrate, famotidine, atorvastatin, montelukast, doxycycline, methocarbamol, and meloxicam.   Meds ordered this encounter  Medications   traZODone (DESYREL) 50 MG tablet    Sig: Take 2 tablets (100 mg total) by mouth at bedtime.    Dispense:  90 tablet    Refill:  0    Order Specific Question:   Supervising Provider    Answer:   Crecencio Mc [[id number]]     Return precautions given.   Risks, benefits, and alternatives of the medications and treatment plan prescribed today were discussed, and patient expressed understanding.   Education regarding symptom management and diagnosis given to patient on AVS.   Continue to follow with Burnard Hawthorne, FNP for routine health maintenance.   Ashlee Bass and I agreed  with plan.   Mable Paris, FNP

## 2021-08-20 NOTE — Assessment & Plan Note (Signed)
Suboptimal control.  Advised to increase trazodone to 100 mg.  She will let me know how she is doing on this dose

## 2021-08-22 LAB — HEPATITIS C ANTIBODY
Hepatitis C Ab: NONREACTIVE
SIGNAL TO CUT-OFF: 0.06 (ref <1.00)

## 2021-08-22 LAB — HIV ANTIBODY (ROUTINE TESTING W REFLEX): HIV 1&2 Ab, 4th Generation: NONREACTIVE

## 2021-08-24 ENCOUNTER — Other Ambulatory Visit: Payer: Self-pay | Admitting: Family

## 2021-08-24 DIAGNOSIS — R7989 Other specified abnormal findings of blood chemistry: Secondary | ICD-10-CM

## 2021-08-24 LAB — CYTOLOGY - PAP
Comment: NEGATIVE
Diagnosis: NEGATIVE
Diagnosis: REACTIVE
High risk HPV: NEGATIVE

## 2021-08-25 ENCOUNTER — Encounter: Payer: Self-pay | Admitting: Family

## 2021-10-01 ENCOUNTER — Other Ambulatory Visit: Payer: Self-pay

## 2021-10-01 ENCOUNTER — Ambulatory Visit
Admission: RE | Admit: 2021-10-01 | Discharge: 2021-10-01 | Disposition: A | Discharge status: Home / Self Care | Payer: BC Managed Care – PPO | Source: Ambulatory Visit | Attending: Family | Admitting: Family

## 2021-10-01 DIAGNOSIS — Z0000 Encounter for general adult medical examination without abnormal findings: Secondary | ICD-10-CM | POA: Insufficient documentation

## 2021-10-01 DIAGNOSIS — Z1231 Encounter for screening mammogram for malignant neoplasm of breast: Secondary | ICD-10-CM | POA: Insufficient documentation

## 2021-10-08 NOTE — Progress Notes (Signed)
I have sent fax to [facility].

## 2021-10-29 ENCOUNTER — Other Ambulatory Visit: Payer: Self-pay

## 2021-10-29 ENCOUNTER — Other Ambulatory Visit (INDEPENDENT_AMBULATORY_CARE_PROVIDER_SITE_OTHER): Payer: BC Managed Care – PPO

## 2021-10-29 DIAGNOSIS — R7989 Other specified abnormal findings of blood chemistry: Secondary | ICD-10-CM | POA: Diagnosis not present

## 2021-10-29 LAB — CBC WITH DIFFERENTIAL/PLATELET
Basophils Absolute: 0.1 10*3/uL (ref 0.0–0.1)
Basophils Relative: 1.2 % (ref 0.0–3.0)
Eosinophils Absolute: 0.3 10*3/uL (ref 0.0–0.7)
Eosinophils Relative: 4.2 % (ref 0.0–5.0)
HCT: 41.8 % (ref 36.0–46.0)
Hemoglobin: 13.4 g/dL (ref 12.0–15.0)
Lymphocytes Relative: 27.7 % (ref 12.0–46.0)
Lymphs Abs: 1.7 10*3/uL (ref 0.7–4.0)
MCHC: 32.2 g/dL (ref 30.0–36.0)
MCV: 88.1 fl (ref 78.0–100.0)
Monocytes Absolute: 0.6 10*3/uL (ref 0.1–1.0)
Monocytes Relative: 9.5 % (ref 3.0–12.0)
Neutro Abs: 3.6 10*3/uL (ref 1.4–7.7)
Neutrophils Relative %: 57.4 % (ref 43.0–77.0)
Platelets: 269 10*3/uL (ref 150.0–400.0)
RBC: 4.74 Mil/uL (ref 3.87–5.11)
RDW: 14.7 % (ref 11.5–15.5)
WBC: 6.2 10*3/uL (ref 4.0–10.5)

## 2021-11-01 ENCOUNTER — Telehealth: Payer: Self-pay | Admitting: Family

## 2021-11-01 NOTE — Telephone Encounter (Signed)
Please call [facility] where pt had last colonoscopy and EGD 09/30/2014 with Dr Markham Jordan whom has retired.   I am not clear when she was repeat EGD or colonoscopy. Appears upper EUS was abnormal and there was note to repeat in 2016.   Call and inform pt we are looking into the above and you may ask her if she had repeat upper EGD?   If unclear, we can recommend follow up visit and place ref to [facility] re: discuss repeat EGD and colonoscopy screening recommendation

## 2021-11-03 NOTE — Telephone Encounter (Signed)
I called and spoke with patient when I called her in regards to her lab results. She said that she had not had repeat EGD or colonoscopy since Dr. Markham Jordan did the one in 2016. Okay for me to go ahead & place referral back to [facility] for colonoscopy & EGD.

## 2021-11-04 ENCOUNTER — Other Ambulatory Visit: Payer: Self-pay

## 2021-11-04 DIAGNOSIS — Z1211 Encounter for screening for malignant neoplasm of colon: Secondary | ICD-10-CM

## 2021-11-04 NOTE — Telephone Encounter (Signed)
Yes please place GI referral for due EGD and colonscopy

## 2021-11-04 NOTE — Telephone Encounter (Signed)
Referral placed to [facility].

## 2021-11-14 ENCOUNTER — Other Ambulatory Visit: Payer: Self-pay | Admitting: Family

## 2021-11-14 DIAGNOSIS — J302 Other seasonal allergic rhinitis: Secondary | ICD-10-CM

## 2021-11-14 DIAGNOSIS — I10 Essential (primary) hypertension: Secondary | ICD-10-CM

## 2021-11-14 DIAGNOSIS — R062 Wheezing: Secondary | ICD-10-CM

## 2021-11-14 DIAGNOSIS — G4700 Insomnia, unspecified: Secondary | ICD-10-CM

## 2021-11-14 DIAGNOSIS — J4530 Mild persistent asthma, uncomplicated: Secondary | ICD-10-CM

## 2022-01-15 DIAGNOSIS — M25511 Pain in right shoulder: Secondary | ICD-10-CM | POA: Diagnosis not present

## 2022-02-14 ENCOUNTER — Other Ambulatory Visit: Payer: Self-pay | Admitting: Family

## 2022-02-14 DIAGNOSIS — I10 Essential (primary) hypertension: Secondary | ICD-10-CM

## 2022-02-14 DIAGNOSIS — R062 Wheezing: Secondary | ICD-10-CM

## 2022-02-14 DIAGNOSIS — G4700 Insomnia, unspecified: Secondary | ICD-10-CM

## 2022-02-14 DIAGNOSIS — E785 Hyperlipidemia, unspecified: Secondary | ICD-10-CM

## 2022-02-14 DIAGNOSIS — J4530 Mild persistent asthma, uncomplicated: Secondary | ICD-10-CM

## 2022-02-14 DIAGNOSIS — J302 Other seasonal allergic rhinitis: Secondary | ICD-10-CM

## 2022-04-22 DIAGNOSIS — K3189 Other diseases of stomach and duodenum: Secondary | ICD-10-CM | POA: Diagnosis not present

## 2022-04-22 DIAGNOSIS — R1319 Other dysphagia: Secondary | ICD-10-CM | POA: Diagnosis not present

## 2022-04-22 DIAGNOSIS — K219 Gastro-esophageal reflux disease without esophagitis: Secondary | ICD-10-CM | POA: Diagnosis not present

## 2022-04-22 DIAGNOSIS — Z86010 Personal history of colonic polyps: Secondary | ICD-10-CM | POA: Diagnosis not present

## 2022-05-15 ENCOUNTER — Other Ambulatory Visit: Payer: Self-pay | Admitting: Family

## 2022-05-15 DIAGNOSIS — E785 Hyperlipidemia, unspecified: Secondary | ICD-10-CM

## 2022-05-15 DIAGNOSIS — I10 Essential (primary) hypertension: Secondary | ICD-10-CM

## 2022-05-15 DIAGNOSIS — G4700 Insomnia, unspecified: Secondary | ICD-10-CM

## 2022-07-04 DIAGNOSIS — B372 Candidiasis of skin and nail: Secondary | ICD-10-CM | POA: Diagnosis not present

## 2022-07-21 ENCOUNTER — Encounter: Payer: Self-pay | Admitting: *Deleted

## 2022-07-22 ENCOUNTER — Ambulatory Visit: Payer: BC Managed Care – PPO | Admitting: General Practice

## 2022-07-22 ENCOUNTER — Encounter: Payer: Self-pay | Admitting: *Deleted

## 2022-07-22 ENCOUNTER — Other Ambulatory Visit: Payer: Self-pay

## 2022-07-22 ENCOUNTER — Encounter
Admission: RE | Disposition: A | Discharge status: Home / Self Care | Payer: Self-pay | Source: Home / Self Care | Attending: Gastroenterology

## 2022-07-22 ENCOUNTER — Ambulatory Visit
Admission: RE | Admit: 2022-07-22 | Discharge: 2022-07-22 | Disposition: A | Discharge status: Home / Self Care | Payer: BC Managed Care – PPO | Attending: Gastroenterology | Admitting: Gastroenterology

## 2022-07-22 DIAGNOSIS — K449 Diaphragmatic hernia without obstruction or gangrene: Secondary | ICD-10-CM | POA: Insufficient documentation

## 2022-07-22 DIAGNOSIS — Z87891 Personal history of nicotine dependence: Secondary | ICD-10-CM | POA: Insufficient documentation

## 2022-07-22 DIAGNOSIS — I10 Essential (primary) hypertension: Secondary | ICD-10-CM | POA: Insufficient documentation

## 2022-07-22 DIAGNOSIS — K2100 Gastro-esophageal reflux disease with esophagitis, without bleeding: Secondary | ICD-10-CM | POA: Diagnosis not present

## 2022-07-22 DIAGNOSIS — K648 Other hemorrhoids: Secondary | ICD-10-CM | POA: Insufficient documentation

## 2022-07-22 DIAGNOSIS — E785 Hyperlipidemia, unspecified: Secondary | ICD-10-CM | POA: Insufficient documentation

## 2022-07-22 DIAGNOSIS — K640 First degree hemorrhoids: Secondary | ICD-10-CM | POA: Insufficient documentation

## 2022-07-22 DIAGNOSIS — K219 Gastro-esophageal reflux disease without esophagitis: Secondary | ICD-10-CM | POA: Diagnosis not present

## 2022-07-22 DIAGNOSIS — E669 Obesity, unspecified: Secondary | ICD-10-CM | POA: Insufficient documentation

## 2022-07-22 DIAGNOSIS — K5730 Diverticulosis of large intestine without perforation or abscess without bleeding: Secondary | ICD-10-CM | POA: Insufficient documentation

## 2022-07-22 DIAGNOSIS — Z86010 Personal history of colonic polyps: Secondary | ICD-10-CM | POA: Diagnosis not present

## 2022-07-22 DIAGNOSIS — G4730 Sleep apnea, unspecified: Secondary | ICD-10-CM | POA: Diagnosis not present

## 2022-07-22 DIAGNOSIS — R1310 Dysphagia, unspecified: Secondary | ICD-10-CM | POA: Diagnosis not present

## 2022-07-22 DIAGNOSIS — Z1211 Encounter for screening for malignant neoplasm of colon: Secondary | ICD-10-CM | POA: Insufficient documentation

## 2022-07-22 DIAGNOSIS — K649 Unspecified hemorrhoids: Secondary | ICD-10-CM | POA: Diagnosis not present

## 2022-07-22 HISTORY — DX: Sleep apnea, unspecified: G47.30

## 2022-07-22 HISTORY — DX: Personal history of urinary calculi: Z87.442

## 2022-07-22 HISTORY — PX: ESOPHAGOGASTRODUODENOSCOPY (EGD) WITH PROPOFOL: SHX5813

## 2022-07-22 HISTORY — PX: COLONOSCOPY WITH PROPOFOL: SHX5780

## 2022-07-22 SURGERY — COLONOSCOPY WITH PROPOFOL
Anesthesia: General

## 2022-07-22 MED ORDER — PROPOFOL 1000 MG/100ML IV EMUL
INTRAVENOUS | Status: AC
  Filled 2022-07-22: qty 100

## 2022-07-22 MED ORDER — PROPOFOL 500 MG/50ML IV EMUL
INTRAVENOUS | Status: DC | PRN
  Administered 2022-07-22: 150 ug/kg/min via INTRAVENOUS

## 2022-07-22 MED ORDER — LIDOCAINE HCL (PF) 2 % IJ SOLN
INTRAMUSCULAR | Status: AC
  Filled 2022-07-22: qty 5

## 2022-07-22 MED ORDER — SODIUM CHLORIDE 0.9 % IV SOLN: INTRAVENOUS | Status: DC

## 2022-07-22 MED ORDER — LIDOCAINE HCL (CARDIAC) PF 100 MG/5ML IV SOSY
PREFILLED_SYRINGE | INTRAVENOUS | Status: DC | PRN
  Administered 2022-07-22: 50 mg via INTRAVENOUS

## 2022-07-22 MED ORDER — PROPOFOL 10 MG/ML IV BOLUS
INTRAVENOUS | Status: DC | PRN
  Administered 2022-07-22: 50 mg via INTRAVENOUS
  Administered 2022-07-22: 100 mg via INTRAVENOUS
  Administered 2022-07-22: 50 mg via INTRAVENOUS

## 2022-07-22 NOTE — Interval H&P Note (Signed)
History and Physical Interval Note:  07/22/2022 10:32 AM  Ashlee Bass  has presented today for surgery, with the diagnosis of Hx TA Polyps GERD Mass of Stomach.  The various methods of treatment have been discussed with the patient and family. After consideration of risks, benefits and other options for treatment, the patient has consented to  Procedure(s): COLONOSCOPY WITH PROPOFOL (N/A) ESOPHAGOGASTRODUODENOSCOPY (EGD) WITH PROPOFOL (N/A) as a surgical intervention.  The patient's history has been reviewed, patient examined, no change in status, stable for surgery.  I have reviewed the patient's chart and labs.  Questions were answered to the patient's satisfaction.     Lesly Rubenstein  Ok to proceed with EGD/Colonoscopy

## 2022-07-22 NOTE — Transfer of Care (Signed)
Immediate Anesthesia Transfer of Care Note  Patient: Ashlee Bass  Procedure(s) Performed: COLONOSCOPY WITH PROPOFOL ESOPHAGOGASTRODUODENOSCOPY (EGD) WITH PROPOFOL  Patient Location: PACU and Endoscopy Unit  Anesthesia Type:General  Level of Consciousness: awake  Airway & Oxygen Therapy: Patient Spontanous Breathing  Post-op Assessment: Report given to RN  Post vital signs: stable  Last Vitals:  Vitals Value Taken Time  BP    Temp    Pulse    Resp    SpO2      Last Pain:  Vitals:   07/22/22 1004  TempSrc: Temporal  PainSc: 0-No pain         Complications: No notable events documented.

## 2022-07-22 NOTE — H&P (Signed)
Outpatient short stay form Pre-procedure 07/22/2022  Ashlee Rubenstein, MD  Primary Physician: Burnard Hawthorne, FNP  Reason for visit:  Dysphagia/GERD/History of polyps  History of present illness:    61 y/o lady with history of obesity and HLD here for EGD for GERD/Dysphagia and colonoscopy for history of adenomatous polyps. Last colonoscopy in 2015. No blood thinners. No family history of Gi malignancies. No significant abdominal surgeries.    Current Facility-Administered Medications:    0.9 %  sodium chloride infusion, , Intravenous, Continuous, Mirela Parsley, Hilton Cork, MD, Last Rate: 20 mL/hr at 07/22/22 1026, Continued from Pre-op at 07/22/22 1026  Medications Prior to Admission  Medication Sig Dispense Refill Last Dose   doxycycline (VIBRAMYCIN) 100 MG capsule Take 100 mg by mouth 2 (two) times daily.   Past Week   famotidine (PEPCID) 20 MG tablet Take 1 tablet by mouth twice daily 120 tablet 0 07/21/2022   meloxicam (MOBIC) 15 MG tablet Take 15 mg by mouth daily.   Past Week   methocarbamol (ROBAXIN) 500 MG tablet Take 500 mg by mouth 2 (two) times daily.   Past Week   metoprolol tartrate (LOPRESSOR) 50 MG tablet Take 1 tablet by mouth twice daily 180 tablet 0 07/21/2022   montelukast (SINGULAIR) 10 MG tablet TAKE 1 TABLET BY MOUTH AT BEDTIME 90 tablet 0 Past Week   traZODone (DESYREL) 50 MG tablet TAKE 1 TABLET BY MOUTH AT BEDTIME 90 tablet 0 Past Week   atorvastatin (LIPITOR) 20 MG tablet TAKE 1 TABLET BY MOUTH ONCE DAILY AT  6  PM (Patient not taking: Reported on 07/22/2022) 90 tablet 0 Not Taking     Allergies  Allergen Reactions   Codeine Nausea And Vomiting   Penicillins Hives   Telithromycin Other (See Comments)   Azithromycin Itching and Rash     Past Medical History:  Diagnosis Date   Allergy    Asthma    GERD (gastroesophageal reflux disease)    History of kidney stones    Hyperlipidemia    Hypertension    Sleep apnea    Urinary tract infection      Review of systems:  Otherwise negative.    Physical Exam  Gen: Alert, oriented. Appears stated age.  HEENT:PERRLA. Lungs: No respiratory distress CV: RRR Abd: soft, benign, no masses Ext: No edema    Planned procedures: Proceed with EGD/colonoscopy. The patient understands the nature of the planned procedure, indications, risks, alternatives and potential complications including but not limited to bleeding, infection, perforation, damage to internal organs and possible oversedation/side effects from anesthesia. The patient agrees and gives consent to proceed.  Please refer to procedure notes for findings, recommendations and patient disposition/instructions.     Ashlee Rubenstein, MD [facility]

## 2022-07-22 NOTE — Anesthesia Postprocedure Evaluation (Signed)
Anesthesia Post Note  Patient: Myha Arizpe  Procedure(s) Performed: COLONOSCOPY WITH PROPOFOL ESOPHAGOGASTRODUODENOSCOPY (EGD) WITH PROPOFOL  Patient location during evaluation: Endoscopy Anesthesia Type: General Level of consciousness: awake and alert Pain management: pain level controlled Vital Signs Assessment: post-procedure vital signs reviewed and stable Respiratory status: spontaneous breathing, nonlabored ventilation, respiratory function stable and patient connected to nasal cannula oxygen Cardiovascular status: blood pressure returned to baseline and stable Postop Assessment: no apparent nausea or vomiting Anesthetic complications: no   No notable events documented.   Last Vitals:  Vitals:   07/22/22 1004 07/22/22 1108  BP: (!) 200/103 (!) 151/87  Pulse: 87 (!) 114  Resp: 20 (!) 23  Temp: (!) 36.3 C   SpO2: 100% 97%    Last Pain:  Vitals:   07/22/22 1108  TempSrc:   PainSc: 0-No pain                 Dimas Millin

## 2022-07-22 NOTE — Op Note (Signed)
[facility] Patient Name: Ashlee Bass Procedure Date: 07/22/2022 10:28 AM MRN: [id number] Account #: [id number] Date of Birth: [birth year] Admit Type: Outpatient Age: 61 Room: [facility] ENDO ROOM 3 Gender: Female Note Status: Finalized Instrument Name: Altamese Cabal Endoscope [id number] Procedure:             Upper GI endoscopy Indications:           Dysphagia, Gastro-esophageal reflux disease Providers:             Andrey Farmer MD, MD Referring MD:          Yvetta Coder. Arnett (Referring MD) Medicines:             Monitored Anesthesia Care Complications:         No immediate complications. Estimated blood loss:                         Minimal. Procedure:             Pre-Anesthesia Assessment:                        - Prior to the procedure, a History and Physical was                         performed, and patient medications and allergies were                         reviewed. The patient is competent. The risks and                         benefits of the procedure and the sedation options and                         risks were discussed with the patient. All questions                         were answered and informed consent was obtained.                         Patient identification and proposed procedure were                         verified by the physician, the nurse, the                         anesthesiologist, the anesthetist and the technician                         in the endoscopy suite. Mental Status Examination:                         alert and oriented. Airway Examination: normal                         oropharyngeal airway and neck mobility. Respiratory                         Examination: clear to auscultation. CV Examination:  normal. Prophylactic Antibiotics: The patient does not                         require prophylactic antibiotics. Prior                         Anticoagulants: The patient has taken no previous                          anticoagulant or antiplatelet agents. ASA Grade                         Assessment: III - A patient with severe systemic                         disease. After reviewing the risks and benefits, the                         patient was deemed in satisfactory condition to                         undergo the procedure. The anesthesia plan was to use                         monitored anesthesia care (MAC). Immediately prior to                         administration of medications, the patient was                         re-assessed for adequacy to receive sedatives. The                         heart rate, respiratory rate, oxygen saturations,                         blood pressure, adequacy of pulmonary ventilation, and                         response to care were monitored throughout the                         procedure. The physical status of the patient was                         re-assessed after the procedure.                        After obtaining informed consent, the endoscope was                         passed under direct vision. Throughout the procedure,                         the patient's blood pressure, pulse, and oxygen                         saturations were monitored continuously. The Endoscope  was introduced through the mouth, and advanced to the                         second part of duodenum. The upper GI endoscopy was                         accomplished without difficulty. The patient tolerated                         the procedure well. Findings:      A 4 cm hiatal hernia was present.      Normal mucosa was found in the entire esophagus. Biopsies were obtained       from the proximal and distal esophagus with cold forceps for histology       of suspected eosinophilic esophagitis. Estimated blood loss was minimal.      The entire examined stomach was normal.      The examined duodenum was normal. Impression:             - 4 cm hiatal hernia.                        - Normal mucosa was found in the entire esophagus.                         Biopsied.                        - Normal stomach.                        - Normal examined duodenum. Recommendation:        - Discharge patient to home.                        - Resume previous diet.                        - Continue present medications.                        - Await pathology results.                        - Return to referring physician as previously                         scheduled. Procedure Code(s):     --- Professional ---                        [phone], Esophagogastroduodenoscopy, flexible,                         transoral; with biopsy, single or multiple Diagnosis Code(s):     --- Professional ---                        K44.9, Diaphragmatic hernia without obstruction or                         gangrene  R13.10, Dysphagia, unspecified                        K21.9, Gastro-esophageal reflux disease without                         esophagitis CPT copyright 2019 [employer]. All rights reserved. The codes documented in this report are preliminary and upon coder review may  be revised to meet current compliance requirements. Andrey Farmer MD, MD 07/22/2022 11:04:46 AM Number of Addenda: 0 Note Initiated On: 07/22/2022 10:28 AM Estimated Blood Loss:  Estimated blood loss was minimal.      [facility]

## 2022-07-22 NOTE — Op Note (Signed)
[facility] Gastroenterology Patient Name: Ashlee Bass Procedure Date: 07/22/2022 10:27 AM MRN: [id number] Account #: [id number] Date of Birth: Mar 06, 1961 Admit Type: Outpatient Age: 61 Room: [facility] ENDO ROOM 3 Gender: Female Note Status: Finalized Instrument Name: Park Meo [id number] Procedure:             Colonoscopy Indications:           Surveillance: Personal history of adenomatous polyps                         on last colonoscopy > 5 years ago Providers:             Andrey Farmer MD, MD Referring MD:          Yvetta Coder. Arnett (Referring MD) Medicines:             Monitored Anesthesia Care Complications:         No immediate complications. Procedure:             Pre-Anesthesia Assessment:                        - Prior to the procedure, a History and Physical was                         performed, and patient medications and allergies were                         reviewed. The patient is competent. The risks and                         benefits of the procedure and the sedation options and                         risks were discussed with the patient. All questions                         were answered and informed consent was obtained.                         Patient identification and proposed procedure were                         verified by the physician, the nurse, the                         anesthesiologist, the anesthetist and the technician                         in the endoscopy suite. Mental Status Examination:                         alert and oriented. Airway Examination: normal                         oropharyngeal airway and neck mobility. Respiratory                         Examination: clear to auscultation. CV Examination:  normal. Prophylactic Antibiotics: The patient does not                         require prophylactic antibiotics. Prior                         Anticoagulants: The patient has taken no previous                          anticoagulant or antiplatelet agents. ASA Grade                         Assessment: III - A patient with severe systemic                         disease. After reviewing the risks and benefits, the                         patient was deemed in satisfactory condition to                         undergo the procedure. The anesthesia plan was to use                         monitored anesthesia care (MAC). Immediately prior to                         administration of medications, the patient was                         re-assessed for adequacy to receive sedatives. The                         heart rate, respiratory rate, oxygen saturations,                         blood pressure, adequacy of pulmonary ventilation, and                         response to care were monitored throughout the                         procedure. The physical status of the patient was                         re-assessed after the procedure.                        After obtaining informed consent, the colonoscope was                         passed under direct vision. Throughout the procedure,                         the patient's blood pressure, pulse, and oxygen                         saturations were monitored continuously. The  Colonoscope was introduced through the anus and                         advanced to the the cecum, identified by appendiceal                         orifice and ileocecal valve. The colonoscopy was                         performed without difficulty. The patient tolerated                         the procedure well. The quality of the bowel                         preparation was good. Findings:      The perianal and digital rectal examinations were normal.      A few small-mouthed diverticula were found in the transverse colon.      Many small and large-mouthed diverticula were found in the sigmoid colon       and descending colon.       Internal hemorrhoids were found during retroflexion. The hemorrhoids       were Grade I (internal hemorrhoids that do not prolapse).      The exam was otherwise without abnormality on direct and retroflexion       views. Impression:            - Diverticulosis in the transverse colon.                        - Diverticulosis in the sigmoid colon and in the                         descending colon.                        - Internal hemorrhoids.                        - The examination was otherwise normal on direct and                         retroflexion views.                        - No specimens collected. Recommendation:        - Discharge patient to home.                        - Resume previous diet.                        - Continue present medications.                        - Repeat colonoscopy in 10 years for surveillance.                        - Return to referring physician as previously  scheduled. Procedure Code(s):     --- Professional ---                        V7846, Colorectal cancer screening; colonoscopy on                         individual at high risk Diagnosis Code(s):     --- Professional ---                        Z86.010, Personal history of colonic polyps                        K64.0, First degree hemorrhoids                        K57.30, Diverticulosis of large intestine without                         perforation or abscess without bleeding CPT copyright 2019 American Medical Association. All rights reserved. The codes documented in this report are preliminary and upon coder review may  be revised to meet current compliance requirements. Andrey Farmer MD, MD 07/22/2022 11:10:03 AM Number of Addenda: 0 Note Initiated On: 07/22/2022 10:27 AM Scope Withdrawal Time: 0 hours 8 minutes 55 seconds  Total Procedure Duration: 0 hours 11 minutes 48 seconds  Estimated Blood Loss:  Estimated blood loss: none.      [facility]

## 2022-07-22 NOTE — Anesthesia Preprocedure Evaluation (Signed)
Anesthesia Evaluation  Patient identified by MRN, date of birth, ID band Patient awake    Reviewed: Allergy & Precautions, NPO status , Patient's Chart, lab work & pertinent test results  Airway Mallampati: IV  TM Distance: >3 FB Neck ROM: full  Mouth opening: Limited Mouth Opening  Dental  (+) Dental Advidsory Given, Poor Dentition   Pulmonary asthma , sleep apnea and Continuous Positive Airway Pressure Ventilation , former smoker,    Pulmonary exam normal        Cardiovascular hypertension, (-) angina(-) Past MI and (-) CABG negative cardio ROS Normal cardiovascular exam     Neuro/Psych negative neurological ROS  negative psych ROS   GI/Hepatic negative GI ROS, Neg liver ROS,   Endo/Other  negative endocrine ROS  Renal/GU negative Renal ROS  negative genitourinary   Musculoskeletal   Abdominal   Peds  Hematology negative hematology ROS (+)   Anesthesia Other Findings Past Medical History: No date: Allergy No date: Asthma No date: GERD (gastroesophageal reflux disease) No date: History of kidney stones No date: Hyperlipidemia No date: Hypertension No date: Sleep apnea No date: Urinary tract infection  History reviewed. No pertinent surgical history.  BMI    Body Mass Index: 38.36 kg/m      Reproductive/Obstetrics negative OB ROS                             Anesthesia Physical Anesthesia Plan  ASA: 3  Anesthesia Plan: General   Post-op Pain Management: Minimal or no pain anticipated   Induction: Intravenous  PONV Risk Score and Plan: 3 and Propofol infusion, TIVA and Ondansetron  Airway Management Planned: Nasal Cannula  Additional Equipment: None  Intra-op Plan:   Post-operative Plan:   Informed Consent: I have reviewed the patients History and Physical, chart, labs and discussed the procedure including the risks, benefits and alternatives for the proposed  anesthesia with the patient or authorized representative who has indicated his/her understanding and acceptance.     Dental advisory given  Plan Discussed with: CRNA and Surgeon  Anesthesia Plan Comments: (Discussed risks of anesthesia with patient, including possibility of difficulty with spontaneous ventilation under anesthesia necessitating airway intervention, PONV, and rare risks such as cardiac or respiratory or neurological events, and allergic reactions. Discussed the role of CRNA in patient's perioperative care. Patient understands.)        Anesthesia Quick Evaluation

## 2022-07-25 ENCOUNTER — Encounter: Payer: Self-pay | Admitting: Gastroenterology

## 2022-07-25 LAB — SURGICAL PATHOLOGY

## 2022-08-28 ENCOUNTER — Telehealth: Payer: Self-pay | Admitting: Family

## 2022-08-28 DIAGNOSIS — G4700 Insomnia, unspecified: Secondary | ICD-10-CM

## 2022-09-06 ENCOUNTER — Other Ambulatory Visit: Payer: Self-pay | Admitting: Family

## 2022-09-06 DIAGNOSIS — G4700 Insomnia, unspecified: Secondary | ICD-10-CM

## 2022-09-09 NOTE — Telephone Encounter (Signed)
Pt need refill on traZODone sent to [facility]

## 2022-09-12 ENCOUNTER — Other Ambulatory Visit: Payer: Self-pay | Admitting: Family

## 2022-09-12 ENCOUNTER — Encounter: Payer: Self-pay | Admitting: Family

## 2022-09-12 ENCOUNTER — Other Ambulatory Visit: Payer: Self-pay

## 2022-09-12 DIAGNOSIS — G4700 Insomnia, unspecified: Secondary | ICD-10-CM

## 2022-09-12 MED ORDER — TRAZODONE HCL 100 MG PO TABS: 100.0000 mg | ORAL_TABLET | Freq: Every day | ORAL | 0 refills | Status: DC

## 2022-09-12 NOTE — Telephone Encounter (Signed)
Patient called and made appt for 09/23/2022. She would like to know if Arnett would refill her medication before that appt.

## 2022-09-12 NOTE — Telephone Encounter (Signed)
RX SENT PATIENT IS AWARE

## 2022-09-13 ENCOUNTER — Other Ambulatory Visit: Payer: Self-pay

## 2022-09-13 DIAGNOSIS — G4700 Insomnia, unspecified: Secondary | ICD-10-CM

## 2022-09-13 MED ORDER — TRAZODONE HCL 100 MG PO TABS: 100.0000 mg | ORAL_TABLET | Freq: Every day | ORAL | 1 refills | Status: DC

## 2022-09-13 NOTE — Telephone Encounter (Signed)
Rx refilled and patient is aware

## 2022-09-23 ENCOUNTER — Encounter: Payer: BC Managed Care – PPO | Admitting: Family

## 2022-09-23 DIAGNOSIS — Z20822 Contact with and (suspected) exposure to covid-19: Secondary | ICD-10-CM | POA: Diagnosis not present

## 2022-09-23 DIAGNOSIS — R059 Cough, unspecified: Secondary | ICD-10-CM | POA: Diagnosis not present

## 2022-09-30 ENCOUNTER — Encounter: Payer: Self-pay | Admitting: Family

## 2022-09-30 ENCOUNTER — Ambulatory Visit (INDEPENDENT_AMBULATORY_CARE_PROVIDER_SITE_OTHER): Payer: BC Managed Care – PPO | Admitting: Family

## 2022-09-30 ENCOUNTER — Other Ambulatory Visit (HOSPITAL_COMMUNITY)
Admission: RE | Admit: 2022-09-30 | Discharge: 2022-09-30 | Disposition: A | Discharge status: Home / Self Care | Payer: BC Managed Care – PPO | Source: Ambulatory Visit | Attending: Family | Admitting: Family

## 2022-09-30 VITALS — BP 126/76 | HR 89 | Temp 97.8°F | Wt 202.8 lb

## 2022-09-30 DIAGNOSIS — E669 Obesity, unspecified: Secondary | ICD-10-CM | POA: Diagnosis not present

## 2022-09-30 DIAGNOSIS — I10 Essential (primary) hypertension: Secondary | ICD-10-CM | POA: Diagnosis not present

## 2022-09-30 DIAGNOSIS — J209 Acute bronchitis, unspecified: Secondary | ICD-10-CM

## 2022-09-30 DIAGNOSIS — Z0000 Encounter for general adult medical examination without abnormal findings: Secondary | ICD-10-CM

## 2022-09-30 DIAGNOSIS — E785 Hyperlipidemia, unspecified: Secondary | ICD-10-CM | POA: Diagnosis not present

## 2022-09-30 LAB — LIPID PANEL
Cholesterol: 175 mg/dL (ref 0–200)
HDL: 46.6 mg/dL
LDL Cholesterol: 105 mg/dL — ABNORMAL HIGH (ref 0–99)
NonHDL: 128.06
Total CHOL/HDL Ratio: 4
Triglycerides: 115 mg/dL (ref 0.0–149.0)
VLDL: 23 mg/dL (ref 0.0–40.0)

## 2022-09-30 LAB — CBC WITH DIFFERENTIAL/PLATELET
Basophils Absolute: 0 10*3/uL (ref 0.0–0.1)
Basophils Relative: 0.7 % (ref 0.0–3.0)
Eosinophils Absolute: 0.3 10*3/uL (ref 0.0–0.7)
Eosinophils Relative: 5.3 % — ABNORMAL HIGH (ref 0.0–5.0)
HCT: 43.2 % (ref 36.0–46.0)
Hemoglobin: 14.1 g/dL (ref 12.0–15.0)
Lymphocytes Relative: 27.4 % (ref 12.0–46.0)
Lymphs Abs: 1.7 10*3/uL (ref 0.7–4.0)
MCHC: 32.6 g/dL (ref 30.0–36.0)
MCV: 88.4 fl (ref 78.0–100.0)
Monocytes Absolute: 0.7 10*3/uL (ref 0.1–1.0)
Monocytes Relative: 11.7 % (ref 3.0–12.0)
Neutro Abs: 3.4 10*3/uL (ref 1.4–7.7)
Neutrophils Relative %: 54.9 % (ref 43.0–77.0)
Platelets: 323 10*3/uL (ref 150.0–400.0)
RBC: 4.88 Mil/uL (ref 3.87–5.11)
RDW: 14.6 % (ref 11.5–15.5)
WBC: 6.1 10*3/uL (ref 4.0–10.5)

## 2022-09-30 LAB — HEMOGLOBIN A1C: Hgb A1c MFr Bld: 6.4 % (ref 4.6–6.5)

## 2022-09-30 LAB — COMPREHENSIVE METABOLIC PANEL WITH GFR
ALT: 18 U/L (ref 0–35)
AST: 16 U/L (ref 0–37)
Albumin: 4.2 g/dL (ref 3.5–5.2)
Alkaline Phosphatase: 110 U/L (ref 39–117)
BUN: 14 mg/dL (ref 6–23)
CO2: 29 meq/L (ref 19–32)
Calcium: 10.6 mg/dL — ABNORMAL HIGH (ref 8.4–10.5)
Chloride: 105 meq/L (ref 96–112)
Creatinine, Ser: 0.84 mg/dL (ref 0.40–1.20)
GFR: 75.64 mL/min
Glucose, Bld: 102 mg/dL — ABNORMAL HIGH (ref 70–99)
Potassium: 4.9 meq/L (ref 3.5–5.1)
Sodium: 141 meq/L (ref 135–145)
Total Bilirubin: 0.6 mg/dL (ref 0.2–1.2)
Total Protein: 7.1 g/dL (ref 6.0–8.3)

## 2022-09-30 LAB — TSH: TSH: 1.89 u[IU]/mL (ref 0.35–5.50)

## 2022-09-30 LAB — VITAMIN D 25 HYDROXY (VIT D DEFICIENCY, FRACTURES): VITD: 27.6 ng/mL — ABNORMAL LOW (ref 30.00–100.00)

## 2022-09-30 MED ORDER — DOXYCYCLINE HYCLATE 100 MG PO TABS: 100.0000 mg | ORAL_TABLET | Freq: Two times a day (BID) | ORAL | 0 refills | Status: AC

## 2022-09-30 MED ORDER — ATORVASTATIN CALCIUM 20 MG PO TABS: ORAL_TABLET | ORAL | 3 refills | Status: DC

## 2022-09-30 MED ORDER — PSEUDOEPH-BROMPHEN-DM 30-2-10 MG/5ML PO SYRP: 10.0000 mL | ORAL_SOLUTION | Freq: Every evening | ORAL | 0 refills | Status: DC | PRN

## 2022-09-30 MED ORDER — ALBUTEROL SULFATE HFA 108 (90 BASE) MCG/ACT IN AERS: 2.0000 | INHALATION_SPRAY | Freq: Four times a day (QID) | RESPIRATORY_TRACT | 0 refills | Status: DC | PRN

## 2022-09-30 NOTE — Assessment & Plan Note (Signed)
Patient well-appearing and nontoxic in appearance.  No respiratory distress.  Based on duration of symptoms concern for bacterial URI.  Start doxycycline.  She will start probiotics. I provider her with Albuterol to use as needed.  I have refilled cough syrup.

## 2022-09-30 NOTE — Assessment & Plan Note (Signed)
Chronic, stable. Continue toprol tartrate 50 mg twice daily 

## 2022-09-30 NOTE — Assessment & Plan Note (Signed)
Clinical breast exam performed today.  Although I was not able to definitively feel discrete mass right axillary region, patient and I agreed we would pursue diagnostic images to be conservative.  Encouraged low glycemic diet.

## 2022-09-30 NOTE — Assessment & Plan Note (Signed)
Chronic, presumed stable.  Pending lipid panel.  Continue atorvastatin 20 mg daily

## 2022-09-30 NOTE — Progress Notes (Signed)
Subjective:    Patient ID: Ashlee Bass, female    DOB: 1961/07/23, 61 y.o.   MRN: [id number]  CC: Ashlee Bass is a 61 y.o. female who presents today for physical exam, acute visit, and follow up.     HPI: Complains of cough.  Symptoms started 14 days ago, waxing and waning. Cough worsened 9 days ago.  Grandchildren had RSV whom she keeps.  Seen at urgent care and tested negative flu and covid per patient. She has had intermittent wheezing and would clear once coughed.  Endorses hoarseness, nasal congestion She had one day of chills.  No CP, sob, fever  She is using cough syrup , Nyquil, mucinex with some relief.  Tessalon perles are not effective for her.   She has been on prednisone in the past with relief.     H/o asthma, sleep apnea She is compliant with cipap.  HTN-compliant with Toprol tartrate 50 mg twice daily HLD-compliant with atorvastatin 20 mg daily  Colorectal Cancer Screening: UTD , 07/22/22 Dr Mia CreekLocklear, repeat in 10 years.  Breast Cancer Screening: Mammogram due.  A few weeks ago she felt small bumps under her right armpit.  Nontender.  Cervical Cancer Screening: due, previous Pap smear 08/20/2021 negative HPV malignancy.  Benign reparative changes.   Lung Cancer Screening: Doesn't have 20 year pack year history and age > [age] years yo 89 years       Tetanus - UTD         Labs: Screening labs today. Exercise: No formal exercise. She is taking care of grandchildren.   Alcohol use:  none Smoking/tobacco use: former smoker.     HISTORY:  Past Medical History:  Diagnosis Date   Allergy    Asthma    GERD (gastroesophageal reflux disease)    History of kidney stones    Hyperlipidemia    Hypertension    Sleep apnea    Urinary tract infection     Past Surgical History:  Procedure Laterality Date   COLONOSCOPY WITH PROPOFOL N/A 07/22/2022   Procedure: COLONOSCOPY WITH PROPOFOL;  Surgeon: Regis BillLocklear, Cameron T, MD;  Location: [facility] ENDOSCOPY;   Service: Endoscopy;  Laterality: N/A;   ESOPHAGOGASTRODUODENOSCOPY (EGD) WITH PROPOFOL N/A 07/22/2022   Procedure: ESOPHAGOGASTRODUODENOSCOPY (EGD) WITH PROPOFOL;  Surgeon: Regis BillLocklear, Cameron T, MD;  Location: [facility] ENDOSCOPY;  Service: Endoscopy;  Laterality: N/A;   Family History  Problem Relation Age of Onset   Arthritis Mother    Hyperlipidemia Mother    Heart disease Mother        Heart Failure   Hypertension Mother    Diabetes Mother    COPD Mother    Arthritis Father    Heart disease Father    Hyperlipidemia Father    Hypertension Father    Kidney disease Father        ESRD - dialysis   Arthritis Paternal Grandmother    Breast cancer Paternal Grandmother       ALLERGIES: Codeine, Penicillins, Telithromycin, and Azithromycin  Current Outpatient Medications on File Prior to Visit  Medication Sig Dispense Refill   famotidine (PEPCID) 20 MG tablet Take 1 tablet by mouth twice daily 120 tablet 0   meloxicam (MOBIC) 15 MG tablet Take 15 mg by mouth daily.     methocarbamol (ROBAXIN) 500 MG tablet Take 500 mg by mouth 2 (two) times daily.     metoprolol tartrate (LOPRESSOR) 50 MG tablet Take 1 tablet by mouth twice daily 180 tablet 0   montelukast (  SINGULAIR) 10 MG tablet TAKE 1 TABLET BY MOUTH AT BEDTIME 90 tablet 0   traZODone (DESYREL) 100 MG tablet Take 1 tablet (100 mg total) by mouth at bedtime. Make appt with PCP for more refills 30 tablet 1   No current facility-administered medications on file prior to visit.    Social History   Tobacco Use   Smoking status: Former    Types: Cigarettes    Quit date: 10/24/1980    Years since quitting: 41.9   Smokeless tobacco: Never  Vaping Use   Vaping Use: Never used  Substance Use Topics   Alcohol use: No    Alcohol/week: 0.0 standard drinks of alcohol   Drug use: No    Review of Systems  Constitutional:  Negative for chills, fever and unexpected weight change.  HENT:  Positive for congestion and voice change.    Respiratory:  Positive for cough and wheezing.   Cardiovascular:  Negative for chest pain, palpitations and leg swelling.  Gastrointestinal:  Negative for nausea and vomiting.  Musculoskeletal:  Negative for arthralgias and myalgias.  Skin:  Negative for rash.  Neurological:  Negative for headaches.  Hematological:  Negative for adenopathy.  Psychiatric/Behavioral:  Negative for confusion.       Objective:    BP 126/76   Pulse 89   Temp 97.8 F (36.6 C) (Oral)   Wt 202 lb 12.8 oz (92 kg)   SpO2 96%   BMI 38.32 kg/m   BP Readings from Last 3 Encounters:  09/30/22 126/76  07/22/22 (!) 150/91  08/20/21 128/84   Wt Readings from Last 3 Encounters:  09/30/22 202 lb 12.8 oz (92 kg)  07/22/22 203 lb (92.1 kg)  08/20/21 198 lb 6.4 oz (90 kg)    Physical Exam Vitals reviewed.  Constitutional:      Appearance: Normal appearance. She is well-developed.  HENT:     Head: Normocephalic and atraumatic.     Right Ear: Hearing, tympanic membrane, ear canal and external ear normal. No decreased hearing noted. No drainage, swelling or tenderness. No middle ear effusion. No foreign body. Tympanic membrane is not erythematous or bulging.     Left Ear: Hearing, tympanic membrane, ear canal and external ear normal. No decreased hearing noted. No drainage, swelling or tenderness.  No middle ear effusion. No foreign body. Tympanic membrane is not erythematous or bulging.     Nose: Nose normal. No rhinorrhea.     Right Sinus: No maxillary sinus tenderness or frontal sinus tenderness.     Left Sinus: No maxillary sinus tenderness or frontal sinus tenderness.     Mouth/Throat:     Pharynx: Uvula midline. No oropharyngeal exudate or posterior oropharyngeal erythema.     Tonsils: No tonsillar abscesses.  Eyes:     Conjunctiva/sclera: Conjunctivae normal.  Neck:     Thyroid: No thyroid mass or thyromegaly.  Cardiovascular:     Rate and Rhythm: Normal rate and regular rhythm.     Pulses: Normal  pulses.     Heart sounds: Normal heart sounds.  Pulmonary:     Effort: Pulmonary effort is normal.     Breath sounds: Normal breath sounds. No wheezing, rhonchi or rales.  Chest:     Chest wall: No mass or swelling.  Breasts:    Breasts are symmetrical.     Right: No inverted nipple, mass, nipple discharge, skin change or tenderness.     Left: No inverted nipple, mass, nipple discharge, skin change or tenderness.  Comments: Right axilla where patient report mass. Unable to feel distinct mass on exam today.  Abdominal:     General: Bowel sounds are normal. There is no distension.     Palpations: Abdomen is soft. Abdomen is not rigid. There is no fluid wave or mass.     Tenderness: There is no abdominal tenderness. There is no guarding or rebound.  Genitourinary:    Cervix: No cervical motion tenderness, discharge or friability.     Uterus: Not enlarged, not fixed and not tender.      Adnexa:        Right: No mass, tenderness or fullness.         Left: No mass, tenderness or fullness.       Comments: Pap performed. No CMT. Unable to appreciated ovaries. Lymphadenopathy:     Head:     Right side of head: No submental, submandibular, tonsillar, preauricular, posterior auricular or occipital adenopathy.     Left side of head: No submental, submandibular, tonsillar, preauricular, posterior auricular or occipital adenopathy.     Cervical: No cervical adenopathy.     Right cervical: No superficial, deep or posterior cervical adenopathy.    Left cervical: No superficial, deep or posterior cervical adenopathy.     Upper Body:     Right upper body: No pectoral adenopathy.     Left upper body: No pectoral adenopathy.  Skin:    General: Skin is warm and dry.  Neurological:     Mental Status: She is alert.  Psychiatric:        Speech: Speech normal.        Behavior: Behavior normal.        Thought Content: Thought content normal.        Assessment & Plan:   Problem List Items  Addressed This Visit       Cardiovascular and Mediastinum   Essential hypertension    Chronic, stable. Continue toprol tartrate 50 mg twice daily      Relevant Medications   atorvastatin (LIPITOR) 20 MG tablet   Other Relevant Orders   CBC with Differential/Platelet   Comprehensive metabolic panel     Respiratory   Acute bronchitis    Patient well-appearing and nontoxic in appearance.  No respiratory distress.  Based on duration of symptoms concern for bacterial URI.  Start doxycycline.  She will start probiotics. I provider her with Albuterol to use as needed.  I have refilled cough syrup.       Relevant Medications   brompheniramine-pseudoephedrine-DM 30-2-10 MG/5ML syrup   doxycycline (VIBRA-TABS) 100 MG tablet   albuterol (VENTOLIN HFA) 108 (90 Base) MCG/ACT inhaler     Other   Annual physical exam - Primary    Clinical breast exam performed today.  Although I was not able to definitively feel discrete mass right axillary region, patient and I agreed we would pursue diagnostic images to be conservative.  Encouraged low glycemic diet.      Relevant Orders   CBC with Differential/Platelet   Comprehensive metabolic panel   Cytology - PAP   Hemoglobin A1c   Lipid panel   TSH   VITAMIN D 25 Hydroxy (Vit-D Deficiency, Fractures)   MM DIAG BREAST TOMO BILATERAL   US BREAST LTD UNI RIGHT INC AXILLA   Hyperlipidemia    Chronic, presumed stable.  Pending lipid panel.  Continue atorvastatin 20 mg daily      Relevant Medications   atorvastatin (LIPITOR) 20 MG tablet   Other Relevant  Orders   Comprehensive metabolic panel   Obese   Relevant Orders   Hemoglobin A1c   Lipid panel   TSH     I have discontinued Ashlee Bass's doxycycline. I am also having her start on brompheniramine-pseudoephedrine-DM, doxycycline, and albuterol. Additionally, I am having her maintain her famotidine, methocarbamol, meloxicam, montelukast, metoprolol tartrate, traZODone, and  atorvastatin.   Meds ordered this encounter  Medications   brompheniramine-pseudoephedrine-DM 30-2-10 MG/5ML syrup    Sig: Take 10 mLs by mouth at bedtime as needed.    Dispense:  70 mL    Refill:  0    Order Specific Question:   Supervising Provider    Answer:   Duncan Dull L [[id number]]   doxycycline (VIBRA-TABS) 100 MG tablet    Sig: Take 1 tablet (100 mg total) by mouth 2 (two) times daily for 7 days.    Dispense:  14 tablet    Refill:  0    Order Specific Question:   Supervising Provider    Answer:   Duncan Dull L [[id number]]   atorvastatin (LIPITOR) 20 MG tablet    Sig: TAKE 1 TABLET BY MOUTH ONCE DAILY AT  6  PM    Dispense:  90 tablet    Refill:  3    Order Specific Question:   Supervising Provider    Answer:   Sherlene Shams [[id number]]   albuterol (VENTOLIN HFA) 108 (90 Base) MCG/ACT inhaler    Sig: Inhale 2 puffs into the lungs every 6 (six) hours as needed for wheezing or shortness of breath.    Dispense:  8 g    Refill:  0    Order Specific Question:   Supervising Provider    Answer:   Sherlene Shams [[id number]]    Return precautions given.   Risks, benefits, and alternatives of the medications and treatment plan prescribed today were discussed, and patient expressed understanding.   Education regarding symptom management and diagnosis given to patient on AVS.   Continue to follow with Allegra Grana, FNP for routine health maintenance.   Ashlee Main and I agreed with plan.   Rennie Plowman, FNP

## 2022-09-30 NOTE — Patient Instructions (Addendum)
Start doxycycline, antibiotic.  I provided you with albuterol as well.    Use albuterol every 6 hours for first 24 hours to get good medication into the lungs and loosen congestion; after, you may use as needed and eventually stop all together when cough resolves.  Ensure to take probiotics while on antibiotics and also for 2 weeks after completion. This can either be by eating yogurt daily or taking a probiotic supplement over the counter such as Culturelle.It is important to re-colonize the gut with good bacteria and also to prevent any diarrheal infections associated with antibiotic use.  Please call  and schedule your 3D mammogram and /or bone density scan as we discussed.   [facility]  ( new location in 2023)  [address], [place], [place] [address]  [place], [place]  [phone]   Please download Myfitness Pal App ( free). You may log every thing you eat for even 2-3 days to get a better of idea of true daily calories. To loose weight, you have to use more calories than than consumed and essentially create caloric deficit to loose weight. The goal is 1-2 lbs per week of weight loss.   You review below from [facility].   [email or web address]://[email or web address].it  Calorie counting made easy  Eat less, exercise more. If only it were that simple! As most dieters know, losing weight can be very challenging. As this report details, a range of influences can affect how people gain and lose weight. But a basic understanding of how to tip your energy balance in favor of weight loss is a good place to start.  Start by determining how many calories you should consume each day. To do so, you need to know how many calories you need to maintain your current weight. Doing this requires a few simple calculations.  First, multiply your current weight by 15 -- that's roughly the number of calories per pound of body weight needed to maintain  your current weight if you are moderately active. Moderately active means getting at least 30 minutes of physical activity a day in the form of exercise (walking at a brisk pace, climbing stairs, or active gardening). Let's say you're a woman who is 5 feet, 4 inches tall and weighs 155 pounds, and you need to lose about 15 pounds to put you in a healthy weight range. If you multiply 155 by 15, you will get 2,325, which is the number of calories per day that you need in order to maintain your current weight (weight-maintenance calories). To lose weight, you will need to get below that total.  For example, to lose 1 to 2 pounds a week -- a rate that experts consider safe -- your food consumption should provide 500 to 1,000 calories less than your total weight-maintenance calories. If you need 2,325 calories a day to maintain your current weight, reduce your daily calories to between 1,325 and 1,825. If you are sedentary, you will also need to build more activity into your day. In order to lose at least a pound a week, try to do at least 30 minutes of physical activity on most days, and reduce your daily calorie intake by at least 500 calories. However, calorie intake should not fall below 1,200 a day in women or 1,500 a day in men, except under the supervision of a health professional. Eating too few calories can endanger your health by depriving you of needed nutrients.  Meeting your calorie target How can you  meet your daily calorie target? One approach is to add up the number of calories per serving of all the foods that you eat, and then plan your menus accordingly. You can buy books that list calories per serving for many foods. In addition, the nutrition labels on all packaged foods and beverages provide calories per serving information. Make a point of reading the labels of the foods and drinks you use, noting the number of calories and the serving sizes. Many recipes published in cookbooks, newspapers,  and magazines provide similar information.  If you hate counting calories, a different approach is to restrict how much and how often you eat, and to eat meals that are low in calories. Dietary guidelines issued by the American Heart Association stress common sense in choosing your foods rather than focusing strictly on numbers, such as total calories or calories from fat. Whichever method you choose, research shows that a regular eating schedule -- with meals and snacks planned for certain times each day -- makes for the most successful approach. The same applies after you have lost weight and want to keep it off. Sticking with an eating schedule increases your chance of maintaining your new weight.    This is  Dr. Lupita Dawn  ( an amazing physician in my office!)  example of a  "Low GI"  Diet:  It will allow you to lose 4 to 8  lbs  per month if you follow it carefully.  Your goal with exercise is a minimum of 30 minutes of aerobic exercise 5 days per week (Walking does not count once it becomes easy!)    All of the foods can be found at grocery stores and in bulk at [employer].  The Atkins protein bars and shakes are available in more varieties at [employer], [employer] and [place].     7 AM Breakfast:  Choose from the following:  Low carbohydrate Protein  Shakes (I recommend the  Premier Protein chocolate shakes,  EAS AdvantEdge "Carb Control" shakes  Or the Atkins shakes all are under 3 net carbs)     a scrambled egg/bacon/cheese burrito made with Mission's "carb balance" whole wheat tortilla  (about 10 net carbs )  Regulatory affairs officer (basically a quiche without the pastry crust) that is eaten cold and very convenient way to get your eggs.  8 carbs)  If you make your own protein shakes, avoid bananas and pineapple,  And use low carb greek yogurt or original /unsweetened almond or soy milk    Avoid cereal and bananas, oatmeal and cream of wheat and grits. They are loaded with  carbohydrates!   10 AM: high protein snack:  Protein bar by Atkins (the snack size, under 200 cal, usually < 6 net carbs).    A stick of cheese:  Around 1 carb,  100 cal     Dannon Light n Fit [place] Yogurt  (80 cal, 8 carbs)  Other so called "protein bars" and Greek yogurts tend to be loaded with carbohydrates.  Remember, in food advertising, the word "energy" is synonymous for " carbohydrate."  Lunch:   A Sandwich using the bread choices listed, Can use any  Eggs,  lunchmeat, grilled meat or canned tuna), avocado, regular mayo/mustard  and cheese.  A Salad using blue cheese, ranch,  Goddess or vinagrette,  Avoid taco shells, croutons or "confetti" and no "candied nuts" but regular nuts OK.   No pretzels, nabs  or chips.  Angie Fava and  miniature sweet peppers are a good low carb alternative that provide a "crunch"  The bread is the only source of carbohydrate in a sandwich and  can be decreased by trying some of the attached alternatives to traditional loaf bread   Avoid "Low fat dressings, as well as Barry Brunner and [employer] dressings They are loaded with sugar!   3 PM/ Mid day  Snack:  Consider  1 ounce of  almonds, walnuts, pistachios, pecans, peanuts,  Macadamia nuts or a nut medley.  Avoid "granola and granola bars "  Mixed nuts are ok in moderation as long as there are no raisins,  cranberries or dried fruit.   KIND bars are OK if you get the low glycemic index variety   Try the prosciutto/mozzarella cheese sticks by [employer]  In deli /backery section   High protein      6 PM  Dinner:     Meat/fowl/fish with a green salad, and either broccoli, cauliflower, green beans, spinach, brussel sprouts or  Lima beans. DO NOT BREAD THE PROTEIN!!      There is a low carb pasta by Dreamfield's that is acceptable and tastes great: only 5 digestible carbs/serving.( All grocery stores but BJs carry it ) Several ready made meals are available low carb:   Try Michel Angelo's chicken piccata or  chicken or eggplant parm over low carb pasta.(Lowes and BJs)   Marjory Lies Sanchez's "Carnitas" (pulled pork, no sauce,  0 carbs) or his beef pot roast to make a dinner burrito (at [employer]J's)  Pesto over low carb pasta (bj's sells a good quality pesto in the center refrigerated section of the deli   Try satueeing  Cheral Marker with mushroooms as a good side   [employer] makes a mashed cauliflower that tastes like mashed potatoes  Whole wheat pasta is still full of digestible carbs and  Not as low in glycemic index as Dreamfield's.   Brown rice is still rice,  So skip the rice and noodles if you eat [place] or [place] (or at least limit to 1/2 cup)  9 PM snack :   Breyer's "low carb" fudgsicle or  ice cream bar (Carb Smart line), or  Weight Watcher's ice cream bar , or another "no sugar added" ice cream;  a serving of fresh berries/cherries with whipped cream   Cheese or DANNON'S LlGHT N FIT GREEK YOGURT  8 ounces of [place] unsweetened almond/cococunut milk    Treat yourself to a parfait made with whipped cream blueberiies, walnuts and vanilla greek yogurt  Avoid bananas, pineapple, grapes  and watermelon on a regular basis because they are high in sugar.  THINK OF THEM AS DESSERT  Remember that snack Substitutions should be less than 10 NET carbs per serving and meals < 20 carbs. Remember to subtract fiber grams to get the "net carbs."   Health Maintenance for Postmenopausal Women Menopause is a normal process in which your ability to get pregnant comes to an end. This process happens slowly over many months or years, usually between the ages of 36 and 31. Menopause is complete when you have missed your menstrual period for 12 months. It is important to talk with your health care provider about some of the most common conditions that affect women after menopause (postmenopausal women). These include heart disease, cancer, and bone loss (osteoporosis). Adopting a healthy lifestyle and getting preventive  care can help to promote your health and wellness. The actions you take can also lower your  chances of developing some of these common conditions. What are the signs and symptoms of menopause? During menopause, you may have the following symptoms: Hot flashes. These can be moderate or severe. Night sweats. Decrease in sex drive. Mood swings. Headaches. Tiredness (fatigue). Irritability. Memory problems. Problems falling asleep or staying asleep. Talk with your health care provider about treatment options for your symptoms. Do I need hormone replacement therapy? Hormone replacement therapy is effective in treating symptoms that are caused by menopause, such as hot flashes and night sweats. Hormone replacement carries certain risks, especially as you become older. If you are thinking about using estrogen or estrogen with progestin, discuss the benefits and risks with your health care provider. How can I reduce my risk for heart disease and stroke? The risk of heart disease, heart attack, and stroke increases as you age. One of the causes may be a change in the body's hormones during menopause. This can affect how your body uses dietary fats, triglycerides, and cholesterol. Heart attack and stroke are medical emergencies. There are many things that you can do to help prevent heart disease and stroke. Watch your blood pressure High blood pressure causes heart disease and increases the risk of stroke. This is more likely to develop in people who have high blood pressure readings or are overweight. Have your blood pressure checked: Every 3-5 years if you are 42-48 years of age. Every year if you are 28 years old or older. Eat a healthy diet  Eat a diet that includes plenty of vegetables, fruits, low-fat dairy products, and lean protein. Do not eat a lot of foods that are high in solid fats, added sugars, or sodium. Get regular exercise Get regular exercise. This is one of the most important  things you can do for your health. Most adults should: Try to exercise for at least 150 minutes each week. The exercise should increase your heart rate and make you sweat (moderate-intensity exercise). Try to do strengthening exercises at least twice each week. Do these in addition to the moderate-intensity exercise. Spend less time sitting. Even light physical activity can be beneficial. Other tips Work with your health care provider to achieve or maintain a healthy weight. Do not use any products that contain nicotine or tobacco. These products include cigarettes, chewing tobacco, and vaping devices, such as e-cigarettes. If you need help quitting, ask your health care provider. Know your numbers. Ask your health care provider to check your cholesterol and your blood sugar (glucose). Continue to have your blood tested as directed by your health care provider. Do I need screening for cancer? Depending on your health history and family history, you may need to have cancer screenings at different stages of your life. This may include screening for: Breast cancer. Cervical cancer. Lung cancer. Colorectal cancer. What is my risk for osteoporosis? After menopause, you may be at increased risk for osteoporosis. Osteoporosis is a condition in which bone destruction happens more quickly than new bone creation. To help prevent osteoporosis or the bone fractures that can happen because of osteoporosis, you may take the following actions: If you are 23-58 years old, get at least 1,000 mg of calcium and at least 600 international units (IU) of vitamin D per day. If you are older than age 55 but younger than age 82, get at least 1,200 mg of calcium and at least 600 international units (IU) of vitamin D per day. If you are older than age 52, get at least 1,200 mg  of calcium and at least 800 international units (IU) of vitamin D per day. Smoking and drinking excessive alcohol increase the risk of osteoporosis.  Eat foods that are rich in calcium and vitamin D, and do weight-bearing exercises several times each week as directed by your health care provider. How does menopause affect my mental health? Depression may occur at any age, but it is more common as you become older. Common symptoms of depression include: Feeling depressed. Changes in sleep patterns. Changes in appetite or eating patterns. Feeling an overall lack of motivation or enjoyment of activities that you previously enjoyed. Frequent crying spells. Talk with your health care provider if you think that you are experiencing any of these symptoms. General instructions See your health care provider for regular wellness exams and vaccines. This may include: Scheduling regular health, dental, and eye exams. Getting and maintaining your vaccines. These include: Influenza vaccine. Get this vaccine each year before the flu season begins. Pneumonia vaccine. Shingles vaccine. Tetanus, diphtheria, and pertussis (Tdap) booster vaccine. Your health care provider may also recommend other immunizations. Tell your health care provider if you have ever been abused or do not feel safe at home. Summary Menopause is a normal process in which your ability to get pregnant comes to an end. This condition causes hot flashes, night sweats, decreased interest in sex, mood swings, headaches, or lack of sleep. Treatment for this condition may include hormone replacement therapy. Take actions to keep yourself healthy, including exercising regularly, eating a healthy diet, watching your weight, and checking your blood pressure and blood sugar levels. Get screened for cancer and depression. Make sure that you are up to date with all your vaccines. This information is not intended to replace advice given to you by your health care provider. Make sure you discuss any questions you have with your health care provider. Document Revised: 03/01/2021 Document Reviewed:  03/01/2021 Elsevier Patient Education  2023 [employer].

## 2022-10-05 ENCOUNTER — Telehealth: Payer: Self-pay | Admitting: Family

## 2022-10-05 LAB — CYTOLOGY - PAP
Comment: NEGATIVE
Diagnosis: NEGATIVE
Diagnosis: REACTIVE
High risk HPV: NEGATIVE

## 2022-10-05 NOTE — Telephone Encounter (Signed)
I spoke with pt she will call [place] to sch. Thanks

## 2022-10-09 ENCOUNTER — Other Ambulatory Visit: Payer: Self-pay | Admitting: Family

## 2022-10-09 DIAGNOSIS — R899 Unspecified abnormal finding in specimens from other organs, systems and tissues: Secondary | ICD-10-CM

## 2022-10-14 ENCOUNTER — Ambulatory Visit
Admission: RE | Admit: 2022-10-14 | Discharge: 2022-10-14 | Disposition: A | Discharge status: Home / Self Care | Payer: BC Managed Care – PPO | Source: Ambulatory Visit | Attending: Family | Admitting: Family

## 2022-10-14 ENCOUNTER — Other Ambulatory Visit: Payer: Self-pay | Admitting: Family

## 2022-10-14 DIAGNOSIS — R928 Other abnormal and inconclusive findings on diagnostic imaging of breast: Secondary | ICD-10-CM | POA: Diagnosis not present

## 2022-10-14 DIAGNOSIS — Z0000 Encounter for general adult medical examination without abnormal findings: Secondary | ICD-10-CM | POA: Insufficient documentation

## 2022-10-14 DIAGNOSIS — J209 Acute bronchitis, unspecified: Secondary | ICD-10-CM

## 2022-10-14 DIAGNOSIS — E669 Obesity, unspecified: Secondary | ICD-10-CM

## 2022-10-14 DIAGNOSIS — E785 Hyperlipidemia, unspecified: Secondary | ICD-10-CM

## 2022-10-14 DIAGNOSIS — N6489 Other specified disorders of breast: Secondary | ICD-10-CM | POA: Diagnosis not present

## 2022-10-14 DIAGNOSIS — I10 Essential (primary) hypertension: Secondary | ICD-10-CM

## 2022-11-11 ENCOUNTER — Telehealth: Payer: Self-pay | Admitting: Family

## 2022-11-11 NOTE — Telephone Encounter (Signed)
Pt called in for some advice, as per pt she's having a really bad cough and headaches. She would like to know what can she do or take.

## 2022-11-14 NOTE — Telephone Encounter (Signed)
LVM to call back to office  

## 2022-11-15 NOTE — Telephone Encounter (Signed)
Spoke to pt and she stated that she was feeling better and had been taking otc medication and she  did not think she needed to  come in for an appt. Pt stated that she would call back if she started feeling bad again.

## 2022-12-06 ENCOUNTER — Other Ambulatory Visit: Payer: Self-pay | Admitting: Family

## 2022-12-06 DIAGNOSIS — I10 Essential (primary) hypertension: Secondary | ICD-10-CM

## 2022-12-16 ENCOUNTER — Other Ambulatory Visit: Payer: Self-pay

## 2022-12-16 ENCOUNTER — Ambulatory Visit (INDEPENDENT_AMBULATORY_CARE_PROVIDER_SITE_OTHER): Payer: BC Managed Care – PPO | Admitting: Family

## 2022-12-16 ENCOUNTER — Encounter: Payer: Self-pay | Admitting: Family

## 2022-12-16 VITALS — BP 128/72 | HR 81 | Temp 98.4°F | Ht <= 58 in | Wt 206.2 lb

## 2022-12-16 DIAGNOSIS — G4700 Insomnia, unspecified: Secondary | ICD-10-CM

## 2022-12-16 DIAGNOSIS — E785 Hyperlipidemia, unspecified: Secondary | ICD-10-CM

## 2022-12-16 DIAGNOSIS — R899 Unspecified abnormal finding in specimens from other organs, systems and tissues: Secondary | ICD-10-CM | POA: Diagnosis not present

## 2022-12-16 DIAGNOSIS — Z8639 Personal history of other endocrine, nutritional and metabolic disease: Secondary | ICD-10-CM

## 2022-12-16 DIAGNOSIS — I10 Essential (primary) hypertension: Secondary | ICD-10-CM

## 2022-12-16 DIAGNOSIS — G4733 Obstructive sleep apnea (adult) (pediatric): Secondary | ICD-10-CM

## 2022-12-16 LAB — CBC WITH DIFFERENTIAL/PLATELET
Basophils Absolute: 0.1 10*3/uL (ref 0.0–0.1)
Basophils Relative: 1 % (ref 0.0–3.0)
Eosinophils Absolute: 0.3 10*3/uL (ref 0.0–0.7)
Eosinophils Relative: 4.8 % (ref 0.0–5.0)
HCT: 41.2 % (ref 36.0–46.0)
Hemoglobin: 13.5 g/dL (ref 12.0–15.0)
Lymphocytes Relative: 23.6 % (ref 12.0–46.0)
Lymphs Abs: 1.3 10*3/uL (ref 0.7–4.0)
MCHC: 32.8 g/dL (ref 30.0–36.0)
MCV: 87.5 fl (ref 78.0–100.0)
Monocytes Absolute: 0.4 10*3/uL (ref 0.1–1.0)
Monocytes Relative: 8.3 % (ref 3.0–12.0)
Neutro Abs: 3.3 10*3/uL (ref 1.4–7.7)
Neutrophils Relative %: 62.3 % (ref 43.0–77.0)
Platelets: 259 10*3/uL (ref 150.0–400.0)
RBC: 4.7 Mil/uL (ref 3.87–5.11)
RDW: 15 % (ref 11.5–15.5)
WBC: 5.3 10*3/uL (ref 4.0–10.5)

## 2022-12-16 LAB — VITAMIN D 25 HYDROXY (VIT D DEFICIENCY, FRACTURES): VITD: 20.74 ng/mL — ABNORMAL LOW (ref 30.00–100.00)

## 2022-12-16 MED ORDER — METOPROLOL TARTRATE 50 MG PO TABS: 50.0000 mg | ORAL_TABLET | Freq: Two times a day (BID) | ORAL | 3 refills | Status: DC

## 2022-12-16 MED ORDER — ATORVASTATIN CALCIUM 20 MG PO TABS: ORAL_TABLET | ORAL | 3 refills | Status: DC

## 2022-12-16 MED ORDER — TRAZODONE HCL 100 MG PO TABS: 100.0000 mg | ORAL_TABLET | Freq: Every day | ORAL | 3 refills | Status: DC

## 2022-12-16 MED ORDER — TRAZODONE HCL 50 MG PO TABS: 25.0000 mg | ORAL_TABLET | Freq: Every evening | ORAL | 3 refills | Status: DC | PRN

## 2022-12-16 MED ORDER — ATORVASTATIN CALCIUM 40 MG PO TABS: 40.0000 mg | ORAL_TABLET | Freq: Every evening | ORAL | 3 refills | Status: DC

## 2022-12-16 NOTE — Assessment & Plan Note (Signed)
She is compliant with CPAP

## 2022-12-16 NOTE — Progress Notes (Signed)
Assessment & Plan:  History of vitamin D deficiency -     VITAMIN D 25 Hydroxy (Vit-D Deficiency, Fractures); Future  Essential hypertension Assessment & Plan: Chronic, stable. Continue toprol tartrate 50 mg twice daily  Orders: -     Metoprolol Tartrate; Take 1 tablet (50 mg total) by mouth 2 (two) times daily.  Dispense: 180 tablet; Refill: 3  Insomnia, unspecified type Assessment & Plan: Suboptimal control.  Increase trazodone 100 mg nightly to 150 mg nightly.  May consider Ambien in the future.  Orders: -     traZODone HCl; Take 0.5-1 tablets (25-50 mg total) by mouth at bedtime as needed for sleep.  Dispense: 90 tablet; Refill: 3 -     traZODone HCl; Take 1 tablet (100 mg total) by mouth at bedtime. Make appt with PCP for more refills  Dispense: 90 tablet; Refill: 3  Hyperlipidemia, unspecified hyperlipidemia type -     Atorvastatin Calcium; Take 1 tablet (40 mg total) by mouth every evening.  Dispense: 90 tablet; Refill: 3  Abnormal laboratory test -     CBC with Differential/Platelet -     PTH, Intact (ICMA) and Ionized Calcium  OSA (obstructive sleep apnea) Assessment & Plan: She is compliant with CPAP      Return precautions given.   Risks, benefits, and alternatives of the medications and treatment plan prescribed today were discussed, and patient expressed understanding.   Education regarding symptom management and diagnosis given to patient on AVS either electronically or printed.  Return for Complete Physical Exam.  Mable Paris, FNP  Subjective:    Patient ID: Ashlee Bass, female    DOB: [birth year], 62 y.o.   MRN: [id number]  CC: Ashlee Bass is a 62 y.o. female who presents today for follow up.   HPI: Follow-up labs, vitamin D deficiency, eosinophilia, elevated calcium She has a harder time staying asleep. She finds that she wakes up on trazodone '100mg'$  qhs.  No anxiety or depression.  Compliant with cipap. No HA.     She  reports that she has been on Ambien in the past  Allergies: Codeine, Penicillins, Telithromycin, and Azithromycin Current Outpatient Medications on File Prior to Visit  Medication Sig Dispense Refill   famotidine (PEPCID) 20 MG tablet Take 1 tablet by mouth twice daily 120 tablet 0   meloxicam (MOBIC) 15 MG tablet Take 15 mg by mouth daily.     methocarbamol (ROBAXIN) 500 MG tablet Take 500 mg by mouth 2 (two) times daily.     No current facility-administered medications on file prior to visit.    Review of Systems  Constitutional:  Negative for chills and fever.  Respiratory:  Negative for cough.   Cardiovascular:  Negative for chest pain and palpitations.  Gastrointestinal:  Negative for nausea and vomiting.  Psychiatric/Behavioral:  Positive for sleep disturbance.       Objective:    BP 128/72   Pulse 81   Temp 98.4 F (36.9 C) (Oral)   Ht '4\' 9"'$  (1.448 m)   Wt 206 lb 3.2 oz (93.5 kg)   SpO2 95%   BMI 44.62 kg/m  BP Readings from Last 3 Encounters:  12/16/22 128/72  09/30/22 126/76  07/22/22 (!) 150/91   Wt Readings from Last 3 Encounters:  12/16/22 206 lb 3.2 oz (93.5 kg)  09/30/22 202 lb 12.8 oz (92 kg)  07/22/22 203 lb (92.1 kg)    Physical Exam Vitals reviewed.  Constitutional:      Appearance: She  is well-developed.  Eyes:     Conjunctiva/sclera: Conjunctivae normal.  Cardiovascular:     Rate and Rhythm: Normal rate and regular rhythm.     Pulses: Normal pulses.     Heart sounds: Normal heart sounds.  Pulmonary:     Effort: Pulmonary effort is normal.     Breath sounds: Normal breath sounds. No wheezing, rhonchi or rales.  Skin:    General: Skin is warm and dry.  Neurological:     Mental Status: She is alert.  Psychiatric:        Speech: Speech normal.        Behavior: Behavior normal.        Thought Content: Thought content normal.

## 2022-12-16 NOTE — Assessment & Plan Note (Signed)
Suboptimal control.  Increase trazodone 100 mg nightly to 150 mg nightly.  May consider Ambien in the future.

## 2022-12-16 NOTE — Patient Instructions (Signed)
In menopause, we recommend cholecalciferol 800 units daily. You may find this over the counter in the drug store.   Please ensure you are following a diet high in calcium -- research shows better outcomes with dietary sources including kale, yogurt, broccolii, cheese, okra, almonds- to name a few.    Increase trazodone from '100mg'$  to either '125mg'$  or '150mg'$ .

## 2022-12-16 NOTE — Assessment & Plan Note (Signed)
Chronic, stable. Continue toprol tartrate 50 mg twice daily

## 2022-12-19 LAB — PTH, INTACT (ICMA) AND IONIZED CALCIUM
Calcium: 10.4 mg/dL (ref 8.6–10.4)
PTH: 132 pg/mL — ABNORMAL HIGH (ref 16–77)

## 2022-12-20 ENCOUNTER — Other Ambulatory Visit: Payer: Self-pay | Admitting: Family

## 2022-12-20 DIAGNOSIS — Z8639 Personal history of other endocrine, nutritional and metabolic disease: Secondary | ICD-10-CM

## 2022-12-20 DIAGNOSIS — Z780 Asymptomatic menopausal state: Secondary | ICD-10-CM

## 2022-12-20 DIAGNOSIS — R899 Unspecified abnormal finding in specimens from other organs, systems and tissues: Secondary | ICD-10-CM

## 2022-12-20 MED ORDER — CHOLECALCIFEROL 1.25 MG (50000 UT) PO TABS: ORAL_TABLET | ORAL | 0 refills | Status: DC

## 2023-02-13 ENCOUNTER — Ambulatory Visit
Admission: RE | Admit: 2023-02-13 | Discharge: 2023-02-13 | Disposition: A | Discharge status: Home / Self Care | Payer: BC Managed Care – PPO | Source: Ambulatory Visit | Attending: Family | Admitting: Family

## 2023-02-13 DIAGNOSIS — Z780 Asymptomatic menopausal state: Secondary | ICD-10-CM | POA: Insufficient documentation

## 2023-02-13 DIAGNOSIS — M8589 Other specified disorders of bone density and structure, multiple sites: Secondary | ICD-10-CM | POA: Diagnosis not present

## 2023-02-13 DIAGNOSIS — Z8639 Personal history of other endocrine, nutritional and metabolic disease: Secondary | ICD-10-CM | POA: Diagnosis not present

## 2023-03-31 ENCOUNTER — Other Ambulatory Visit: Payer: BC Managed Care – PPO

## 2023-04-17 DIAGNOSIS — M170 Bilateral primary osteoarthritis of knee: Secondary | ICD-10-CM | POA: Diagnosis not present

## 2023-05-04 DIAGNOSIS — M170 Bilateral primary osteoarthritis of knee: Secondary | ICD-10-CM | POA: Insufficient documentation

## 2023-06-29 DIAGNOSIS — M170 Bilateral primary osteoarthritis of knee: Secondary | ICD-10-CM | POA: Diagnosis not present

## 2023-08-20 DIAGNOSIS — J209 Acute bronchitis, unspecified: Secondary | ICD-10-CM | POA: Diagnosis not present

## 2023-09-24 DIAGNOSIS — H6692 Otitis media, unspecified, left ear: Secondary | ICD-10-CM | POA: Diagnosis not present

## 2023-09-24 DIAGNOSIS — J209 Acute bronchitis, unspecified: Secondary | ICD-10-CM | POA: Diagnosis not present

## 2023-09-24 DIAGNOSIS — H6123 Impacted cerumen, bilateral: Secondary | ICD-10-CM | POA: Diagnosis not present

## 2023-11-21 ENCOUNTER — Other Ambulatory Visit: Payer: Self-pay | Admitting: Family

## 2023-11-21 DIAGNOSIS — E785 Hyperlipidemia, unspecified: Secondary | ICD-10-CM

## 2023-12-11 ENCOUNTER — Other Ambulatory Visit: Payer: Self-pay | Admitting: Family

## 2023-12-11 DIAGNOSIS — I10 Essential (primary) hypertension: Secondary | ICD-10-CM

## 2023-12-11 DIAGNOSIS — G4700 Insomnia, unspecified: Secondary | ICD-10-CM

## 2023-12-11 NOTE — Telephone Encounter (Signed)
 MyChart message sent to patient.

## 2023-12-22 ENCOUNTER — Encounter: Payer: Self-pay | Admitting: Family

## 2023-12-22 ENCOUNTER — Other Ambulatory Visit (HOSPITAL_COMMUNITY)
Admission: RE | Admit: 2023-12-22 | Discharge: 2023-12-22 | Disposition: A | Discharge status: Home / Self Care | Source: Ambulatory Visit | Attending: Family | Admitting: Family

## 2023-12-22 ENCOUNTER — Ambulatory Visit: Payer: BC Managed Care – PPO | Admitting: Family

## 2023-12-22 VITALS — BP 140/88 | HR 73 | Temp 98.3°F | Wt 203.2 lb

## 2023-12-22 DIAGNOSIS — K219 Gastro-esophageal reflux disease without esophagitis: Secondary | ICD-10-CM | POA: Diagnosis not present

## 2023-12-22 DIAGNOSIS — J209 Acute bronchitis, unspecified: Secondary | ICD-10-CM

## 2023-12-22 DIAGNOSIS — Z8639 Personal history of other endocrine, nutritional and metabolic disease: Secondary | ICD-10-CM

## 2023-12-22 DIAGNOSIS — Z1231 Encounter for screening mammogram for malignant neoplasm of breast: Secondary | ICD-10-CM

## 2023-12-22 DIAGNOSIS — R899 Unspecified abnormal finding in specimens from other organs, systems and tissues: Secondary | ICD-10-CM

## 2023-12-22 DIAGNOSIS — Z0000 Encounter for general adult medical examination without abnormal findings: Secondary | ICD-10-CM | POA: Diagnosis not present

## 2023-12-22 DIAGNOSIS — G4700 Insomnia, unspecified: Secondary | ICD-10-CM

## 2023-12-22 DIAGNOSIS — I10 Essential (primary) hypertension: Secondary | ICD-10-CM

## 2023-12-22 DIAGNOSIS — E785 Hyperlipidemia, unspecified: Secondary | ICD-10-CM

## 2023-12-22 DIAGNOSIS — Z124 Encounter for screening for malignant neoplasm of cervix: Secondary | ICD-10-CM | POA: Diagnosis not present

## 2023-12-22 LAB — URINALYSIS, ROUTINE W REFLEX MICROSCOPIC
Bacteria, UA: NONE SEEN
Bilirubin Urine: NEGATIVE
Hgb urine dipstick: NEGATIVE
Ketones, ur: NEGATIVE
Nitrite: NEGATIVE
RBC / HPF: NONE SEEN (ref 0–?)
Specific Gravity, Urine: 1.015 (ref 1.000–1.030)
Total Protein, Urine: NEGATIVE
Urine Glucose: NEGATIVE
Urobilinogen, UA: 0.2 (ref 0.0–1.0)
pH: 6.5 (ref 5.0–8.0)

## 2023-12-22 LAB — COMPREHENSIVE METABOLIC PANEL
ALT: 15 U/L (ref 0–35)
AST: 17 U/L (ref 0–37)
Albumin: 4.2 g/dL (ref 3.5–5.2)
Alkaline Phosphatase: 102 U/L (ref 39–117)
BUN: 16 mg/dL (ref 6–23)
CO2: 30 meq/L (ref 19–32)
Calcium: 10.4 mg/dL (ref 8.4–10.5)
Chloride: 105 meq/L (ref 96–112)
Creatinine, Ser: 0.84 mg/dL (ref 0.40–1.20)
GFR: 74.99 mL/min (ref >60.00)
Glucose, Bld: 105 mg/dL — ABNORMAL HIGH (ref 70–99)
Potassium: 4.3 meq/L (ref 3.5–5.1)
Sodium: 140 meq/L (ref 135–145)
Total Bilirubin: 0.5 mg/dL (ref 0.2–1.2)
Total Protein: 7.4 g/dL (ref 6.0–8.3)

## 2023-12-22 LAB — CBC WITH DIFFERENTIAL/PLATELET
Basophils Absolute: 0.1 10*3/uL (ref 0.0–0.1)
Basophils Relative: 0.8 % (ref 0.0–3.0)
Eosinophils Absolute: 0.2 10*3/uL (ref 0.0–0.7)
Eosinophils Relative: 3.2 % (ref 0.0–5.0)
HCT: 42.8 % (ref 36.0–46.0)
Hemoglobin: 13.8 g/dL (ref 12.0–15.0)
Lymphocytes Relative: 18.4 % (ref 12.0–46.0)
Lymphs Abs: 1.4 10*3/uL (ref 0.7–4.0)
MCHC: 32.2 g/dL (ref 30.0–36.0)
MCV: 90 fL (ref 78.0–100.0)
Monocytes Absolute: 0.6 10*3/uL (ref 0.1–1.0)
Monocytes Relative: 7.9 % (ref 3.0–12.0)
Neutro Abs: 5.2 10*3/uL (ref 1.4–7.7)
Neutrophils Relative %: 69.7 % (ref 43.0–77.0)
Platelets: 310 10*3/uL (ref 150.0–400.0)
RBC: 4.76 Mil/uL (ref 3.87–5.11)
RDW: 14.4 % (ref 11.5–15.5)
WBC: 7.5 10*3/uL (ref 4.0–10.5)

## 2023-12-22 LAB — LIPID PANEL
Cholesterol: 179 mg/dL (ref 0–200)
HDL: 49.2 mg/dL (ref >39.00)
LDL Cholesterol: 112 mg/dL — ABNORMAL HIGH (ref 0–99)
NonHDL: 129.89
Total CHOL/HDL Ratio: 4
Triglycerides: 91 mg/dL (ref 0.0–149.0)
VLDL: 18.2 mg/dL (ref 0.0–40.0)

## 2023-12-22 LAB — VITAMIN D 25 HYDROXY (VIT D DEFICIENCY, FRACTURES): VITD: 25.87 ng/mL — ABNORMAL LOW (ref 30.00–100.00)

## 2023-12-22 LAB — HEMOGLOBIN A1C: Hgb A1c MFr Bld: 6.3 % (ref 4.6–6.5)

## 2023-12-22 LAB — TSH: TSH: 1.82 u[IU]/mL (ref 0.35–5.50)

## 2023-12-22 MED ORDER — FAMOTIDINE 20 MG PO TABS: 20.0000 mg | ORAL_TABLET | Freq: Two times a day (BID) | ORAL | 3 refills | Status: AC

## 2023-12-22 MED ORDER — ATORVASTATIN CALCIUM 40 MG PO TABS: 40.0000 mg | ORAL_TABLET | Freq: Every evening | ORAL | 3 refills | Status: AC

## 2023-12-22 MED ORDER — TRAZODONE HCL 100 MG PO TABS: 100.0000 mg | ORAL_TABLET | Freq: Every day | ORAL | 3 refills | Status: AC

## 2023-12-22 MED ORDER — TRAZODONE HCL 50 MG PO TABS: 25.0000 mg | ORAL_TABLET | Freq: Every evening | ORAL | 3 refills | Status: AC | PRN

## 2023-12-22 MED ORDER — METOPROLOL TARTRATE 50 MG PO TABS: 50.0000 mg | ORAL_TABLET | Freq: Two times a day (BID) | ORAL | 3 refills | Status: AC

## 2023-12-22 NOTE — Assessment & Plan Note (Signed)
 Elevated today.  Previously has been under better control.  Close follow-up to monitor blood pressure.  Continue metoprolol 50 mg twice daily for now

## 2023-12-22 NOTE — Patient Instructions (Addendum)
 Start mucinex DM; let me  know if symptoms persist.   Please call  and schedule your 3D mammogram and /or bone density scan as we discussed.   [facility]  ( new location in 2023)  [address], [place], [place] [address]  [place], [place]  [phone]   Health Maintenance for Postmenopausal Women Menopause is a normal process in which your ability to get pregnant comes to an end. This process happens slowly over many months or years, usually between the ages of 62 and 55. Menopause is complete when you have missed your menstrual period for 12 months. It is important to talk with your health care provider about some of the most common conditions that affect women after menopause (postmenopausal women). These include heart disease, cancer, and bone loss (osteoporosis). Adopting a healthy lifestyle and getting preventive care can help to promote your health and wellness. The actions you take can also lower your chances of developing some of these common conditions. What are the signs and symptoms of menopause? During menopause, you may have the following symptoms: Hot flashes. These can be moderate or severe. Night sweats. Decrease in sex drive. Mood swings. Headaches. Tiredness (fatigue). Irritability. Memory problems. Problems falling asleep or staying asleep. Talk with your health care provider about treatment options for your symptoms. Do I need hormone replacement therapy? Hormone replacement therapy is effective in treating symptoms that are caused by menopause, such as hot flashes and night sweats. Hormone replacement carries certain risks, especially as you become older. If you are thinking about using estrogen or estrogen with progestin, discuss the benefits and risks with your health care provider. How can I reduce my risk for heart disease and stroke? The risk of heart disease, heart attack, and stroke increases as you age. One of the causes may be a  change in the body's hormones during menopause. This can affect how your body uses dietary fats, triglycerides, and cholesterol. Heart attack and stroke are medical emergencies. There are many things that you can do to help prevent heart disease and stroke. Watch your blood pressure High blood pressure causes heart disease and increases the risk of stroke. This is more likely to develop in people who have high blood pressure readings or are overweight. Have your blood pressure checked: Every 3-5 years if you are 62-88 years of age. Every year if you are 62 years old or older. Eat a healthy diet  Eat a diet that includes plenty of vegetables, fruits, low-fat dairy products, and lean protein. Do not eat a lot of foods that are high in solid fats, added sugars, or sodium. Get regular exercise Get regular exercise. This is one of the most important things you can do for your health. Most adults should: Try to exercise for at least 150 minutes each week. The exercise should increase your heart rate and make you sweat (moderate-intensity exercise). Try to do strengthening exercises at least twice each week. Do these in addition to the moderate-intensity exercise. Spend less time sitting. Even light physical activity can be beneficial. Other tips Work with your health care provider to achieve or maintain a healthy weight. Do not use any products that contain nicotine or tobacco. These products include cigarettes, chewing tobacco, and vaping devices, such as e-cigarettes. If you need help quitting, ask your health care provider. Know your numbers. Ask your health care provider to check your cholesterol and your blood sugar (glucose). Continue to have your blood tested as directed  by your health care provider. Do I need screening for cancer? Depending on your health history and family history, you may need to have cancer screenings at different stages of your life. This may include screening for: Breast  cancer. Cervical cancer. Lung cancer. Colorectal cancer. What is my risk for osteoporosis? After menopause, you may be at increased risk for osteoporosis. Osteoporosis is a condition in which bone destruction happens more quickly than new bone creation. To help prevent osteoporosis or the bone fractures that can happen because of osteoporosis, you may take the following actions: If you are 12-21 years old, get at least 1,000 mg of calcium and at least 600 international units (IU) of vitamin D per day. If you are older than age 62 but younger than age 81, get at least 1,200 mg of calcium and at least 600 international units (IU) of vitamin D per day. If you are older than age 62, get at least 1,200 mg of calcium and at least 800 international units (IU) of vitamin D per day. Smoking and drinking excessive alcohol increase the risk of osteoporosis. Eat foods that are rich in calcium and vitamin D, and do weight-bearing exercises several times each week as directed by your health care provider. How does menopause affect my mental health? Depression may occur at any age, but it is more common as you become older. Common symptoms of depression include: Feeling depressed. Changes in sleep patterns. Changes in appetite or eating patterns. Feeling an overall lack of motivation or enjoyment of activities that you previously enjoyed. Frequent crying spells. Talk with your health care provider if you think that you are experiencing any of these symptoms. General instructions See your health care provider for regular wellness exams and vaccines. This may include: Scheduling regular health, dental, and eye exams. Getting and maintaining your vaccines. These include: Influenza vaccine. Get this vaccine each year before the flu season begins. Pneumonia vaccine. Shingles vaccine. Tetanus, diphtheria, and pertussis (Tdap) booster vaccine. Your health care provider may also recommend other  immunizations. Tell your health care provider if you have ever been abused or do not feel safe at home. Summary Menopause is a normal process in which your ability to get pregnant comes to an end. This condition causes hot flashes, night sweats, decreased interest in sex, mood swings, headaches, or lack of sleep. Treatment for this condition may include hormone replacement therapy. Take actions to keep yourself healthy, including exercising regularly, eating a healthy diet, watching your weight, and checking your blood pressure and blood sugar levels. Get screened for cancer and depression. Make sure that you are up to date with all your vaccines. This information is not intended to replace advice given to you by your health care provider. Make sure you discuss any questions you have with your health care provider. Document Revised: 03/01/2021 Document Reviewed: 03/01/2021 Elsevier Patient Education  2024 [employer].

## 2023-12-22 NOTE — Assessment & Plan Note (Signed)
Clinical breast exam performed today.  Pap smear obtained.  Encouraged continued exercise. 

## 2023-12-22 NOTE — Assessment & Plan Note (Signed)
 Afebrile.  Improved.  Exposure to influenza.  Discussed more likely viral etiology.  Advise she may continue Coricidin HBP or start Mucinex DM.  If symptoms persist or worsen advised she would require an antibiotic.  She will let me know how she is doing

## 2023-12-22 NOTE — Progress Notes (Signed)
 Assessment & Plan:  Annual physical exam Assessment & Plan: Clinical breast exam performed today.  Pap smear obtained.  Encouraged continued exercise  Orders: -     VITAMIN D 25 Hydroxy (Vit-D Deficiency, Fractures) -     Urinalysis, Routine w reflex microscopic -     Urine Culture  Hyperlipidemia, unspecified hyperlipidemia type -     Atorvastatin Calcium; Take 1 tablet (40 mg total) by mouth every evening.  Dispense: 90 tablet; Refill: 3 -     Lipid panel  Gastroesophageal reflux disease, unspecified whether esophagitis present -     Famotidine; Take 1 tablet (20 mg total) by mouth 2 (two) times daily.  Dispense: 180 tablet; Refill: 3  Essential hypertension Assessment & Plan: Elevated today.  Previously has been under better control.  Close follow-up to monitor blood pressure.  Continue metoprolol 50 mg twice daily for now  Orders: -     Metoprolol Tartrate; Take 1 tablet (50 mg total) by mouth 2 (two) times daily.  Dispense: 180 tablet; Refill: 3 -     TSH -     CBC with Differential/Platelet -     Comprehensive metabolic panel -     Hemoglobin A1c  Insomnia, unspecified type -     traZODone HCl; Take 1 tablet (100 mg total) by mouth at bedtime. Make appt with PCP for more refills  Dispense: 90 tablet; Refill: 3 -     traZODone HCl; Take 0.5-1 tablets (25-50 mg total) by mouth at bedtime as needed for sleep. Take with trazodone 100mg   Dispense: 90 tablet; Refill: 3  History of vitamin D deficiency  Abnormal laboratory test  Cervical cancer screening -     Cytology - PAP  Encounter for screening mammogram for malignant neoplasm of breast -     3D Screening Mammogram, Left and Right; Future  Acute bronchitis, unspecified organism Assessment & Plan: Afebrile.  Improved.  Exposure to influenza.  Discussed more likely viral etiology.  Advise she may continue Coricidin HBP or start Mucinex DM.  If symptoms persist or worsen advised she would require an antibiotic.  She  will let me know how she is doing      Return precautions given.   Risks, benefits, and alternatives of the medications and treatment plan prescribed today were discussed, and patient expressed understanding.   Education regarding symptom management and diagnosis given to patient on AVS either electronically or printed.  No follow-ups on file.  Rennie Plowman, FNP  Subjective:    Patient ID: Ashlee Bass, female    DOB: May 02, 1961, 63 y.o.   MRN: [id number]  CC: Frederick Klinger is a 63 y.o. female who presents today for physical exam.    HPI: Complains of nasal congestion x 5 days ago,  some better today  Denies F, bodyaches, SOB, CP Endorses fatigue, occasional cough She has been using cloricidin HBP which works well.   Exposed to daughter and granddaughter whom had fever, later confirmed to be influenza.    No abx in the last 3 months   Colorectal Cancer Screening:  UTD , 07/22/22 Dr Mia Creek, repeat in 10 years.  Breast Cancer Screening: Mammogram due Cervical Cancer Screening: due, obtained 09/30/22 , Pap negative for HPV, malignancy.  Benign reactive changes seen    Lung Cancer Screening: Doesn't have 20 year pack year history and age > 60 years yo 43 years          Tetanus - UTD  Pneumococcal - Candidate for; she declines.   Exercise: Gets regular exercise in taking care of grandchildren.  Alcohol use:  none Smoking/tobacco use: Nonsmoker.    Health Maintenance  Topic Date Due   Pneumococcal Vaccination (1 of 2 - PCV) Never done   Zoster (Shingles) Vaccine (1 of 2) Never done   COVID-19 Vaccine (3 - 2024-25 season) 06/25/2023   Pap Smear  10/01/2023   Mammogram  10/15/2023   Flu Shot  01/22/2024*   DTaP/Tdap/Td vaccine (2 - Td or Tdap) 08/27/2028   Colon Cancer Screening  07/22/2032   Hepatitis C Screening  Completed   HIV Screening  Completed   HPV Vaccine  Aged Out  *Topic was postponed. The date shown is not the original due date.     ALLERGIES: Codeine, Penicillins, Telithromycin, and Azithromycin  Current Outpatient Medications on File Prior to Visit  Medication Sig Dispense Refill   Cholecalciferol 1.25 MG (50000 UT) TABS 50,000 units PO qwk for 8 weeks. 8 tablet 0   meloxicam (MOBIC) 15 MG tablet Take 15 mg by mouth daily.     methocarbamol (ROBAXIN) 500 MG tablet Take 500 mg by mouth 2 (two) times daily.     No current facility-administered medications on file prior to visit.    Review of Systems  Constitutional:  Positive for fatigue. Negative for chills and fever.  HENT:  Positive for congestion.   Respiratory:  Positive for cough. Negative for shortness of breath and wheezing.   Cardiovascular:  Negative for chest pain and palpitations.  Gastrointestinal:  Negative for nausea and vomiting.      Objective:    BP (!) 140/88   Pulse 73   Temp 98.3 F (36.8 C) (Oral)   Wt 203 lb 3.2 oz (92.2 kg)   SpO2 97%   BMI 43.97 kg/m   BP Readings from Last 3 Encounters:  12/22/23 (!) 140/88  12/16/22 128/72  09/30/22 126/76   Wt Readings from Last 3 Encounters:  12/22/23 203 lb 3.2 oz (92.2 kg)  12/16/22 206 lb 3.2 oz (93.5 kg)  09/30/22 202 lb 12.8 oz (92 kg)    Physical Exam Vitals reviewed.  Constitutional:      Appearance: She is well-developed.  HENT:     Head: Normocephalic and atraumatic.     Right Ear: Hearing, tympanic membrane, ear canal and external ear normal. No decreased hearing noted. No drainage, swelling or tenderness. No middle ear effusion. No foreign body. Tympanic membrane is not erythematous or bulging.     Left Ear: Hearing, tympanic membrane, ear canal and external ear normal. No decreased hearing noted. No drainage, swelling or tenderness.  No middle ear effusion. No foreign body. Tympanic membrane is not erythematous or bulging.     Nose: Nose normal. No rhinorrhea.     Right Sinus: No maxillary sinus tenderness or frontal sinus tenderness.     Left Sinus: No maxillary  sinus tenderness or frontal sinus tenderness.     Mouth/Throat:     Pharynx: Uvula midline. No oropharyngeal exudate or posterior oropharyngeal erythema.     Tonsils: No tonsillar abscesses.  Eyes:     Conjunctiva/sclera: Conjunctivae normal.  Neck:     Thyroid: No thyroid mass or thyromegaly.  Cardiovascular:     Rate and Rhythm: Normal rate and regular rhythm.     Pulses: Normal pulses.     Heart sounds: Normal heart sounds.  Pulmonary:     Effort: Pulmonary effort is normal.  Breath sounds: Normal breath sounds. No wheezing, rhonchi or rales.  Chest:  Breasts:    Breasts are symmetrical.     Right: No inverted nipple, mass, nipple discharge, skin change or tenderness.     Left: No inverted nipple, mass, nipple discharge, skin change or tenderness.  Genitourinary:    Cervix: No cervical motion tenderness, discharge or friability.     Uterus: Not enlarged, not fixed and not tender.      Adnexa:        Right: No mass, tenderness or fullness.         Left: No mass, tenderness or fullness.       Comments: Pap performed. No CMT. Unable to appreciated ovaries. Lymphadenopathy:     Head:     Right side of head: No submental, submandibular, tonsillar, preauricular, posterior auricular or occipital adenopathy.     Left side of head: No submental, submandibular, tonsillar, preauricular, posterior auricular or occipital adenopathy.     Cervical: No cervical adenopathy.     Right cervical: No superficial, deep or posterior cervical adenopathy.    Left cervical: No superficial, deep or posterior cervical adenopathy.     Upper Body:     Right upper body: No pectoral adenopathy.     Left upper body: No pectoral adenopathy.  Skin:    General: Skin is warm and dry.  Neurological:     Mental Status: She is alert.  Psychiatric:        Speech: Speech normal.        Behavior: Behavior normal.        Thought Content: Thought content normal.

## 2023-12-23 LAB — URINE CULTURE
MICRO NUMBER:: 16143816
Result:: NO GROWTH
SPECIMEN QUALITY:: ADEQUATE

## 2023-12-25 ENCOUNTER — Telehealth: Payer: Self-pay

## 2023-12-25 NOTE — Telephone Encounter (Signed)
-----   Message from Rennie Plowman sent at 12/22/2023 12:49 PM EST ----- Call patient I want to spend more time on blood pressure today.  It was elevated in the office.  Please schedule blood pressure recheck in the next couple of weeks with me.  She may also bring a log from home

## 2023-12-25 NOTE — Telephone Encounter (Signed)
 Spoke to pt went over note pt stated that she will call back to schedule bp check as soon as she finds out when her husband surgery will be, but she will keep a log at home and bring with her to her appt

## 2023-12-27 ENCOUNTER — Telehealth: Payer: Self-pay

## 2023-12-27 NOTE — Telephone Encounter (Signed)
 Spoke to pt she has appt on Nurse visit schedule for  f/up bp visit

## 2023-12-27 NOTE — Telephone Encounter (Signed)
-----   Message from Rennie Plowman sent at 12/22/2023 12:49 PM EST ----- Call patient I want to spend more time on blood pressure today.  It was elevated in the office.  Please schedule blood pressure recheck in the next couple of weeks with me.  She may also bring a log from home

## 2023-12-28 ENCOUNTER — Encounter: Payer: Self-pay | Admitting: Family

## 2023-12-28 ENCOUNTER — Other Ambulatory Visit: Payer: Self-pay | Admitting: Family

## 2023-12-28 DIAGNOSIS — E559 Vitamin D deficiency, unspecified: Secondary | ICD-10-CM

## 2023-12-28 MED ORDER — CHOLECALCIFEROL 1.25 MG (50000 UT) PO TABS: ORAL_TABLET | ORAL | 0 refills | Status: AC

## 2023-12-29 LAB — CYTOLOGY - PAP
Comment: NEGATIVE
Diagnosis: NEGATIVE
High risk HPV: NEGATIVE

## 2024-01-15 ENCOUNTER — Ambulatory Visit

## 2024-01-15 NOTE — Progress Notes (Deleted)
 Pt presented for a BP check. Pt was identified through two identifiers. Pt denied chest pain, shortness of breath, headache, blurred vision, or nausea. After reviewing med list pt confirmed she takes metoprolol tartrate (LOPRESSOR) 50 MG tablet  : Sig: Take 1 tablet (50 mg total) by mouth 2 (two) times daily.     After letting pt rest for about 5-6 minutes:  BP: P:

## 2024-02-23 DIAGNOSIS — J069 Acute upper respiratory infection, unspecified: Secondary | ICD-10-CM | POA: Diagnosis not present

## 2024-03-12 ENCOUNTER — Other Ambulatory Visit: Payer: Self-pay | Admitting: Family

## 2024-03-12 DIAGNOSIS — E559 Vitamin D deficiency, unspecified: Secondary | ICD-10-CM

## 2024-04-11 DIAGNOSIS — M170 Bilateral primary osteoarthritis of knee: Secondary | ICD-10-CM | POA: Diagnosis not present

## 2024-05-10 ENCOUNTER — Ambulatory Visit
Admission: RE | Admit: 2024-05-10 | Discharge: 2024-05-10 | Disposition: A | Discharge status: Home / Self Care | Source: Ambulatory Visit | Attending: Family | Admitting: Family

## 2024-05-10 DIAGNOSIS — Z1231 Encounter for screening mammogram for malignant neoplasm of breast: Secondary | ICD-10-CM | POA: Insufficient documentation

## 2024-05-17 ENCOUNTER — Ambulatory Visit: Payer: Self-pay

## 2024-05-17 DIAGNOSIS — B029 Zoster without complications: Secondary | ICD-10-CM | POA: Diagnosis not present

## 2024-05-17 NOTE — Telephone Encounter (Signed)
 FYI Only or Action Required?: FYI only for provider.  Patient was last seen in primary care on 12/22/2023 by Dineen Rollene MATSU, FNP.  Called Nurse Triage reporting Rash.  Symptoms began several days ago.  Interventions attempted: Nothing.  Symptoms are: unchanged.  Triage Disposition: See Physician Within 24 Hours  Patient/caregiver understands and will follow disposition?: Yes   Going to UC   Copied from CRM #[id number]. Topic: Clinical - Red Word Triage >> May 17, 2024 10:40 AM Harlene ORN wrote: Red Word that prompted transfer to Nurse Triage: rash in her back spreading to her right side of abdomen. Swelling puffy and red. Reason for Disposition  SEVERE itching (i.e., interferes with sleep, normal activities or school)  Answer Assessment - Initial Assessment Questions 1. APPEARANCE of RASH: What does the rash look like? (e.g., blisters, dry flaky skin, red spots, redness, sores)     Red and puffy with little bumps 2. SIZE: How big are the spots? (e.g., tip of pen, eraser, coin; inches, centimeters)     Tiny bumps,  3. LOCATION: Where is the rash located?     Right side of abdomen and right side of back 4. COLOR: What color is the rash? (Note: It is difficult to assess rash color in people with darker-colored skin. When this situation occurs, simply ask the caller to describe what they see.)     red 5. ONSET: When did the rash begin?     Monday 6. FEVER: Do you have a fever? If Yes, ask: What is your temperature, how was it measured, and when did it start?     no 7. ITCHING: Does the rash itch? If Yes, ask: How bad is the itch? (Scale 1-10; or mild, moderate, severe)     Burning moderate 8. CAUSE: What do you think is causing the rash?     shingles 9. MEDICINE FACTORS: Have you started any new medicines within the last 2 weeks? (e.g., antibiotics)      no 10. OTHER SYMPTOMS: Do you have any other symptoms? (e.g., dizziness, headache, sore throat,  joint pain)       Joint pain 11. PREGNANCY: Is there any chance you are pregnant? When was your last menstrual period?       na  Protocols used: Rash or Redness - [facility]

## 2024-05-19 NOTE — Telephone Encounter (Signed)
 noted

## 2024-05-31 ENCOUNTER — Ambulatory Visit (INDEPENDENT_AMBULATORY_CARE_PROVIDER_SITE_OTHER): Admitting: Family

## 2024-05-31 ENCOUNTER — Encounter: Payer: Self-pay | Admitting: Family

## 2024-05-31 VITALS — BP 124/80 | HR 74 | Temp 98.4°F | Ht <= 58 in | Wt 204.0 lb

## 2024-05-31 DIAGNOSIS — G4733 Obstructive sleep apnea (adult) (pediatric): Secondary | ICD-10-CM | POA: Diagnosis not present

## 2024-05-31 DIAGNOSIS — B029 Zoster without complications: Secondary | ICD-10-CM | POA: Insufficient documentation

## 2024-05-31 DIAGNOSIS — M1710 Unilateral primary osteoarthritis, unspecified knee: Secondary | ICD-10-CM | POA: Insufficient documentation

## 2024-05-31 MED ORDER — SCOPOLAMINE 1 MG/3DAYS TD PT72: 1.0000 | MEDICATED_PATCH | TRANSDERMAL | 0 refills | Status: AC

## 2024-05-31 NOTE — Patient Instructions (Addendum)
 Consider zepbound as we discussed for treatment of sleep apnea. Please schedule an appointment with me to discuss further details.   May trial over the counter capsaisin for pain once shingles lesions are completely dried up.    I have sent in scopolamine  patch however there was a recent alert from the FDA in regards to overheating with patch. I would recommend use of benadryl for motion sickness and if needed scopolamine  patch. Ensure you monitor for overheating and are staying hydrated and taking breaks from the heat.   Transderm Bellamy?p (Scopolamine  Transdermal System): Drug Safety Communication - FDA Adds Warning About Serious Risk of Heat-Related Complications with Antinausea Patch  FDA

## 2024-05-31 NOTE — Assessment & Plan Note (Addendum)
 Resolving with a drying rash and no active blisters. Discussed postherpetic neuralgia, vaccination after recovery, and recurrence risk. Continue calamine lotion for relief. Consider capsaicin cream for postherpetic neuralgia if needed. Advised  shingles vaccine post-resolution.

## 2024-05-31 NOTE — Progress Notes (Signed)
 Assessment & Plan:  Herpes zoster without complication Assessment & Plan: Resolving with a drying rash and no active blisters. Discussed postherpetic neuralgia, vaccination after recovery, and recurrence risk. Continue calamine lotion for relief. Consider capsaicin cream for postherpetic neuralgia if needed. Advised  shingles vaccine post-resolution.   OSA (obstructive sleep apnea) Assessment & Plan:   Severe obstructive sleep apnea is managed with CPAP. Concern for poor control due to fatigue. Discussed Zepbound as an alternative if CPAP and advised to consider Zepbound; she will let me know.  We also discussed in detail to monitor fatigue and report if symptom persists, worsens to let me know   Other orders -     Scopolamine ; Place 1 patch (1.5 mg total) onto the skin every 3 (three) days.  Dispense: 10 patch; Refill: 0     Return precautions given.   Risks, benefits, and alternatives of the medications and treatment plan prescribed today were discussed, and patient expressed understanding.   Education regarding symptom management and diagnosis given to patient on AVS either electronically or printed.  Return for Complete Physical Exam.  Rollene Northern, FNP  Subjective:    Patient ID: Ashlee Bass, female    DOB: Dec 03, 1960, 63 y.o.   MRN: [id number]  CC: Ashlee Bass is a 63 y.o. female who presents today for follow up.   HPI: HPI Discussed the use of AI scribe software for clinical note transcription with the patient, who gave verbal consent to proceed.  History of Present Illness   Ashlee Bass is a 63 year old female who presents for follow-up of shingles.  She was initially seen at urgent care on July 25th for suspected shingles on her back. Her symptoms have since improved, with reduced itching and pain. Initially, she experienced significant itching and pain on her back and ribs, which she initially thought was a pulled muscle. Rash primarily on  the right side of her back and did not cross the midline. She notes  a small rash on her right side lower stomach that did not blister. She has been using calamine lotion for relief, which she finds cooling and effective in drying up the rash. She was prescribed Valtrex, an antiviral, and completed a course of prednisone . She was also given tramadol for pain but did not need to use it, opting for Tylenol instead. Denies tick bite, fevers or chills.  She has a history of sleep apnea, diagnosed in July 2019 via a home sleep study. She uses a CPAP machine and reports that she sometimes wakes up tired but feels her sleep has improved with trazodone . She experiences occasional daytime sleepiness, particularly around lunchtime, but attributes this to caring for her grandchildren. Denies unusual weight loss, fever, chills, bone pain.Mammogram and colonoscopy are UTD            Seen at [facility] or shingles 05/17/24 for herpes zoster on her back Given tramadol , prednisone , valtrex    Allergies: Codeine, Penicillins, Telithromycin, and Azithromycin Current Outpatient Medications on File Prior to Visit  Medication Sig Dispense Refill   atorvastatin  (LIPITOR) 40 MG tablet Take 1 tablet (40 mg total) by mouth every evening. 90 tablet 3   Cholecalciferol  1.25 MG (50000 UT) TABS 50,000 units PO qwk for 8 weeks. 8 tablet 0   meloxicam (MOBIC) 15 MG tablet Take 15 mg by mouth daily.     methocarbamol (ROBAXIN) 500 MG tablet Take 500 mg by mouth 2 (two) times daily.  metoprolol  tartrate (LOPRESSOR ) 50 MG tablet Take 1 tablet (50 mg total) by mouth 2 (two) times daily. 180 tablet 3   traZODone  (DESYREL ) 100 MG tablet Take 1 tablet (100 mg total) by mouth at bedtime. Make appt with PCP for more refills 90 tablet 3   traZODone  (DESYREL ) 50 MG tablet Take 0.5-1 tablets (25-50 mg total) by mouth at bedtime as needed for sleep. Take with trazodone  100mg  90 tablet 3   famotidine  (PEPCID ) 20 MG tablet Take 1 tablet (20  mg total) by mouth 2 (two) times daily. 180 tablet 3   No current facility-administered medications on file prior to visit.    Review of Systems  Constitutional:  Negative for chills and fever.  Respiratory:  Negative for cough.   Cardiovascular:  Negative for chest pain and palpitations.  Gastrointestinal:  Negative for nausea and vomiting.  Skin:  Positive for rash.      Objective:    BP 124/80 (BP Location: Left Arm, Patient Position: Sitting, Cuff Size: Normal)   Pulse 74   Temp 98.4 F (36.9 C) (Oral)   Ht 4' 9 (1.448 m)   Wt 204 lb (92.5 kg)   SpO2 94%   BMI 44.15 kg/m  BP Readings from Last 3 Encounters:  05/31/24 124/80  12/22/23 (!) 140/88  12/16/22 128/72   Wt Readings from Last 3 Encounters:  05/31/24 204 lb (92.5 kg)  12/22/23 203 lb 3.2 oz (92.2 kg)  12/16/22 206 lb 3.2 oz (93.5 kg)    Physical Exam Vitals reviewed.  Constitutional:      Appearance: She is well-developed.  Eyes:     Conjunctiva/sclera: Conjunctivae normal.  Cardiovascular:     Rate and Rhythm: Normal rate and regular rhythm.     Pulses: Normal pulses.     Heart sounds: Normal heart sounds.  Pulmonary:     Effort: Pulmonary effort is normal.     Breath sounds: Normal breath sounds. No wheezing, rhonchi or rales.  Skin:    General: Skin is warm and dry.  Neurological:     Mental Status: She is alert.  Psychiatric:        Speech: Speech normal.        Behavior: Behavior normal.        Thought Content: Thought content normal.

## 2024-05-31 NOTE — Assessment & Plan Note (Signed)
 Severe obstructive sleep apnea is managed with CPAP. Concern for poor control due to fatigue. Discussed Zepbound as an alternative if CPAP and advised to consider Zepbound; she will let me know.  We also discussed in detail to monitor fatigue and report if symptom persists, worsens to let me know

## 2024-06-14 DIAGNOSIS — M170 Bilateral primary osteoarthritis of knee: Secondary | ICD-10-CM | POA: Diagnosis not present

## 2024-07-23 DIAGNOSIS — M170 Bilateral primary osteoarthritis of knee: Secondary | ICD-10-CM | POA: Diagnosis not present
